# Patient Record
Sex: Female | Born: 1945 | Race: White | Hispanic: No | Marital: Married | Smoking: Former smoker
Health system: Southern US, Community
[De-identification: ages and names within clinical notes are randomized; demographics above are authoritative.]

## PROBLEM LIST (undated history)

## (undated) DIAGNOSIS — C801 Malignant (primary) neoplasm, unspecified: Secondary | ICD-10-CM

## (undated) DIAGNOSIS — Z9889 Other specified postprocedural states: Secondary | ICD-10-CM

## (undated) DIAGNOSIS — M1990 Unspecified osteoarthritis, unspecified site: Secondary | ICD-10-CM

## (undated) DIAGNOSIS — R112 Nausea with vomiting, unspecified: Secondary | ICD-10-CM

## (undated) HISTORY — PX: BREAST EXCISIONAL BIOPSY: SUR124

## (undated) HISTORY — PX: AXILLARY LYMPH NODE BIOPSY: SHX5737

## (undated) HISTORY — PX: APPENDECTOMY: SHX54

## (undated) HISTORY — PX: ABDOMINAL HYSTERECTOMY: SHX81

## (undated) HISTORY — PX: OOPHORECTOMY: SHX86

---

## 2006-04-16 ENCOUNTER — Ambulatory Visit: Payer: Self-pay

## 2007-07-29 ENCOUNTER — Ambulatory Visit: Payer: Self-pay

## 2009-06-30 ENCOUNTER — Ambulatory Visit: Payer: Self-pay

## 2010-12-07 ENCOUNTER — Ambulatory Visit: Payer: Self-pay

## 2010-12-08 ENCOUNTER — Ambulatory Visit: Payer: Self-pay

## 2011-05-14 ENCOUNTER — Ambulatory Visit: Payer: Self-pay | Admitting: Surgery

## 2011-12-10 ENCOUNTER — Ambulatory Visit: Payer: Self-pay | Admitting: Surgery

## 2011-12-31 ENCOUNTER — Ambulatory Visit: Payer: Self-pay | Admitting: Surgery

## 2012-01-02 LAB — PATHOLOGY REPORT

## 2012-12-03 HISTORY — PX: BREAST BIOPSY: SHX20

## 2013-02-26 ENCOUNTER — Ambulatory Visit: Payer: Self-pay

## 2013-06-17 DIAGNOSIS — C4492 Squamous cell carcinoma of skin, unspecified: Secondary | ICD-10-CM

## 2013-06-17 DIAGNOSIS — C4491 Basal cell carcinoma of skin, unspecified: Secondary | ICD-10-CM

## 2013-06-17 HISTORY — DX: Squamous cell carcinoma of skin, unspecified: C44.92

## 2013-06-17 HISTORY — DX: Basal cell carcinoma of skin, unspecified: C44.91

## 2013-12-17 ENCOUNTER — Ambulatory Visit: Payer: Self-pay | Admitting: Gastroenterology

## 2014-01-13 ENCOUNTER — Ambulatory Visit: Payer: Self-pay | Admitting: Unknown Physician Specialty

## 2014-03-15 ENCOUNTER — Ambulatory Visit: Payer: Self-pay

## 2015-03-27 NOTE — Op Note (Signed)
PATIENT NAME:  Whitney Liu, Whitney Liu MR#:  [id number] DATE OF BIRTH:  [birth year]  DATE OF PROCEDURE:  12/31/2011  PREOPERATIVE DIAGNOSIS: Right breast mass.   POSTOPERATIVE DIAGNOSIS: Right breast mass.   PROCEDURE: Excision of right breast mass.   SURGEON: Rochel Brome, M.D.   ANESTHESIA: Local 1% Xylocaine with monitored anesthesia care.   INDICATIONS: This 69 year old has a history of right breast nodule. She has had previous fine-needle aspiration which demonstrated some lymphocytes and adipose tissue. She recently had a follow-up ultrasound demonstrating the nodule some 5 millimeters in dimension, hypoechoic and a mammogram demonstrated some slight irregularity of the margins of the mass and she had preop x-ray needle localization and excision was recommended for further evaluation and treatment.   DESCRIPTION OF PROCEDURE: The patient was placed on the operating table in the supine position. The right arm was placed on a lateral arm support. The dressing was removed from the upper aspect of the right breast exposing the Kopans wire which was cut 3 cm from the skin. The area was prepared with ChloraPrep and draped in a sterile manner. The mammograms were reviewed in the Operating Room immediately prior to incision. The site was examined and could see the location of the wire in the axillary portion of the 11 o'clock position of the right breast. The skin inferior to this was infiltrated with Xylocaine. A curvilinear obliquely oriented incision was made over the intended site of the thick portion of the wire and dissection was carried down through subcutaneous tissues. The location of the thick portion of the wire was demonstrated. There was a palpable nodule adjacent and slightly posterior to the thick portion of the wire. A sample of tissue surrounding the nodule was removed using electrocautery for hemostasis. Two bleeding points were clamped with hemostats. The specimen was excised and submitted for  specimen mammogram. The two bleeding points were suture ligated with 4-0 chromic. Several small bleeding points were cauterized. Hemostasis was now intact. The wound was palpated. There was no other palpable mass within the operative site. Next, the skin was closed with a running 4-0 chromic subcuticular suture.   The radiology department called to report that the mass was seen in the specimen mammogram and this was submitted for routine pathology. Next, the wound was treated with Dermabond and allowed to dry. The patient tolerated the procedure satisfactorily and was then prepared for transfer to the recovery room.  ____________________________ Lenna Sciara. Rochel Brome, MD jws:ap D: 12/31/2011 11:37:35 ET T: 12/31/2011 11:43:51 ET JOB#: [id number]  cc: Loreli Dollar, MD, <Dictator> Loreli Dollar MD ELECTRONICALLY SIGNED 01/06/2012 15:20

## 2015-03-30 ENCOUNTER — Other Ambulatory Visit: Payer: Self-pay | Admitting: Obstetrics and Gynecology

## 2015-03-30 DIAGNOSIS — Z1231 Encounter for screening mammogram for malignant neoplasm of breast: Secondary | ICD-10-CM

## 2015-04-04 DIAGNOSIS — C4492 Squamous cell carcinoma of skin, unspecified: Secondary | ICD-10-CM

## 2015-04-04 HISTORY — DX: Squamous cell carcinoma of skin, unspecified: C44.92

## 2015-04-21 ENCOUNTER — Ambulatory Visit
Admission: RE | Admit: 2015-04-21 | Discharge: 2015-04-21 | Disposition: A | Discharge status: Home / Self Care | Payer: Medicare PPO | Source: Ambulatory Visit | Attending: Obstetrics and Gynecology | Admitting: Obstetrics and Gynecology

## 2015-04-21 ENCOUNTER — Other Ambulatory Visit: Payer: Self-pay | Admitting: Obstetrics and Gynecology

## 2015-04-21 DIAGNOSIS — Z1231 Encounter for screening mammogram for malignant neoplasm of breast: Secondary | ICD-10-CM | POA: Diagnosis present

## 2015-04-21 HISTORY — DX: Malignant (primary) neoplasm, unspecified: C80.1

## 2016-03-27 ENCOUNTER — Other Ambulatory Visit: Payer: Self-pay | Admitting: Family Medicine

## 2016-03-27 DIAGNOSIS — Z1231 Encounter for screening mammogram for malignant neoplasm of breast: Secondary | ICD-10-CM

## 2016-04-25 ENCOUNTER — Other Ambulatory Visit: Payer: Self-pay | Admitting: Family Medicine

## 2016-04-25 ENCOUNTER — Ambulatory Visit
Admission: RE | Admit: 2016-04-25 | Discharge: 2016-04-25 | Disposition: A | Discharge status: Home / Self Care | Payer: Medicare Other | Source: Ambulatory Visit | Attending: Family Medicine | Admitting: Family Medicine

## 2016-04-25 DIAGNOSIS — Z1231 Encounter for screening mammogram for malignant neoplasm of breast: Secondary | ICD-10-CM

## 2017-04-08 ENCOUNTER — Other Ambulatory Visit: Payer: Self-pay | Admitting: Family Medicine

## 2017-04-08 DIAGNOSIS — Z1231 Encounter for screening mammogram for malignant neoplasm of breast: Secondary | ICD-10-CM

## 2017-05-08 ENCOUNTER — Ambulatory Visit
Admission: RE | Admit: 2017-05-08 | Discharge: 2017-05-08 | Disposition: A | Discharge status: Home / Self Care | Payer: Medicare Other | Source: Ambulatory Visit | Attending: Family Medicine | Admitting: Family Medicine

## 2017-05-08 ENCOUNTER — Encounter: Payer: Self-pay | Admitting: Radiology

## 2017-05-08 DIAGNOSIS — Z1231 Encounter for screening mammogram for malignant neoplasm of breast: Secondary | ICD-10-CM

## 2017-07-02 ENCOUNTER — Inpatient Hospital Stay: Payer: Medicare Other

## 2017-07-02 ENCOUNTER — Emergency Department: Payer: Medicare Other

## 2017-07-02 ENCOUNTER — Encounter: Payer: Self-pay | Admitting: *Deleted

## 2017-07-02 ENCOUNTER — Inpatient Hospital Stay
Admission: EM | Admit: 2017-07-02 | Discharge: 2017-07-04 | DRG: 392 | Disposition: A | Discharge status: Home / Self Care | Payer: Medicare Other | Attending: Internal Medicine | Admitting: Internal Medicine

## 2017-07-02 DIAGNOSIS — M25551 Pain in right hip: Secondary | ICD-10-CM | POA: Diagnosis present

## 2017-07-02 DIAGNOSIS — Z882 Allergy status to sulfonamides status: Secondary | ICD-10-CM

## 2017-07-02 DIAGNOSIS — M5442 Lumbago with sciatica, left side: Secondary | ICD-10-CM | POA: Diagnosis present

## 2017-07-02 DIAGNOSIS — E785 Hyperlipidemia, unspecified: Secondary | ICD-10-CM | POA: Diagnosis present

## 2017-07-02 DIAGNOSIS — M5441 Lumbago with sciatica, right side: Secondary | ICD-10-CM

## 2017-07-02 DIAGNOSIS — M5410 Radiculopathy, site unspecified: Secondary | ICD-10-CM | POA: Diagnosis present

## 2017-07-02 DIAGNOSIS — Z79899 Other long term (current) drug therapy: Secondary | ICD-10-CM

## 2017-07-02 DIAGNOSIS — Z85828 Personal history of other malignant neoplasm of skin: Secondary | ICD-10-CM | POA: Diagnosis not present

## 2017-07-02 DIAGNOSIS — K5792 Diverticulitis of intestine, part unspecified, without perforation or abscess without bleeding: Principal | ICD-10-CM | POA: Diagnosis present

## 2017-07-02 DIAGNOSIS — Z87891 Personal history of nicotine dependence: Secondary | ICD-10-CM

## 2017-07-02 DIAGNOSIS — Z885 Allergy status to narcotic agent status: Secondary | ICD-10-CM

## 2017-07-02 LAB — URINALYSIS, COMPLETE (UACMP) WITH MICROSCOPIC
BACTERIA UA: NONE SEEN
BILIRUBIN URINE: NEGATIVE
Glucose, UA: NEGATIVE mg/dL
HGB URINE DIPSTICK: NEGATIVE
KETONES UR: NEGATIVE mg/dL
LEUKOCYTES UA: NEGATIVE
NITRITE: NEGATIVE
PROTEIN: NEGATIVE mg/dL
SPECIFIC GRAVITY, URINE: 1.017 (ref 1.005–1.030)
SQUAMOUS EPITHELIAL / LPF: NONE SEEN
pH: 7 (ref 5.0–8.0)

## 2017-07-02 LAB — CBC
HCT: 39.1 % (ref 35.0–47.0)
Hemoglobin: 13.6 g/dL (ref 12.0–16.0)
MCH: 32.2 pg (ref 26.0–34.0)
MCHC: 34.9 g/dL (ref 32.0–36.0)
MCV: 92.5 fL (ref 80.0–100.0)
Platelets: 221 10*3/uL (ref 150–440)
RBC: 4.22 MIL/uL (ref 3.80–5.20)
RDW: 12.6 % (ref 11.5–14.5)
WBC: 11.2 10*3/uL — AB (ref 3.6–11.0)

## 2017-07-02 LAB — BASIC METABOLIC PANEL
ANION GAP: 7 (ref 5–15)
BUN: 12 mg/dL (ref 6–20)
CALCIUM: 9.3 mg/dL (ref 8.9–10.3)
CO2: 24 mmol/L (ref 22–32)
Chloride: 107 mmol/L (ref 101–111)
Creatinine, Ser: 0.82 mg/dL (ref 0.44–1.00)
GFR calc non Af Amer: >60 mL/min (ref >60)
GLUCOSE: 132 mg/dL — AB (ref 65–99)
Potassium: 4.2 mmol/L (ref 3.5–5.1)
Sodium: 138 mmol/L (ref 135–145)

## 2017-07-02 LAB — TSH: TSH: 0.971 u[IU]/mL (ref 0.350–4.500)

## 2017-07-02 MED ORDER — METRONIDAZOLE IN NACL 5-0.79 MG/ML-% IV SOLN
500.0000 mg | Freq: Once | INTRAVENOUS | Status: AC
  Administered 2017-07-02: 500 mg via INTRAVENOUS
  Filled 2017-07-02: qty 100

## 2017-07-02 MED ORDER — MIDAZOLAM HCL 2 MG/2ML IJ SOLN
2.0000 mg | Freq: Once | INTRAMUSCULAR | Status: AC
  Administered 2017-07-02: 2 mg via INTRAVENOUS

## 2017-07-02 MED ORDER — METRONIDAZOLE 500 MG PO TABS
500.0000 mg | ORAL_TABLET | Freq: Three times a day (TID) | ORAL | Status: DC
  Administered 2017-07-02 – 2017-07-04 (×6): 500 mg via ORAL
  Filled 2017-07-02 (×8): qty 1

## 2017-07-02 MED ORDER — ONDANSETRON HCL 4 MG/2ML IJ SOLN
4.0000 mg | Freq: Four times a day (QID) | INTRAMUSCULAR | Status: DC | PRN
  Administered 2017-07-02 (×2): 4 mg via INTRAVENOUS
  Filled 2017-07-02 (×3): qty 2

## 2017-07-02 MED ORDER — DOCUSATE SODIUM 100 MG PO CAPS
100.0000 mg | ORAL_CAPSULE | Freq: Two times a day (BID) | ORAL | Status: DC
  Administered 2017-07-02 – 2017-07-04 (×5): 100 mg via ORAL
  Filled 2017-07-02 (×5): qty 1

## 2017-07-02 MED ORDER — KETOROLAC TROMETHAMINE 30 MG/ML IJ SOLN
30.0000 mg | Freq: Once | INTRAMUSCULAR | Status: AC
  Administered 2017-07-02: 30 mg via INTRAVENOUS
  Filled 2017-07-02: qty 1

## 2017-07-02 MED ORDER — METHYLPREDNISOLONE 4 MG PO TBPK
4.0000 mg | ORAL_TABLET | Freq: Three times a day (TID) | ORAL | Status: AC
  Administered 2017-07-03 (×3): 4 mg via ORAL

## 2017-07-02 MED ORDER — CALCIUM CARBONATE-VITAMIN D 500-200 MG-UNIT PO TABS
1.0000 | ORAL_TABLET | Freq: Two times a day (BID) | ORAL | Status: DC
  Administered 2017-07-02 – 2017-07-03 (×4): 1 via ORAL
  Filled 2017-07-02 (×4): qty 1

## 2017-07-02 MED ORDER — MELOXICAM 7.5 MG PO TABS
15.0000 mg | ORAL_TABLET | Freq: Every day | ORAL | Status: DC
  Administered 2017-07-03: 15 mg via ORAL
  Filled 2017-07-02 (×2): qty 2

## 2017-07-02 MED ORDER — ENOXAPARIN SODIUM 40 MG/0.4ML ~~LOC~~ SOLN
40.0000 mg | SUBCUTANEOUS | Status: DC
  Administered 2017-07-02 – 2017-07-03 (×2): 40 mg via SUBCUTANEOUS
  Filled 2017-07-02: qty 0.4

## 2017-07-02 MED ORDER — CIPROFLOXACIN HCL 500 MG PO TABS
500.0000 mg | ORAL_TABLET | Freq: Two times a day (BID) | ORAL | Status: DC
  Administered 2017-07-02 – 2017-07-04 (×4): 500 mg via ORAL
  Filled 2017-07-02 (×4): qty 1

## 2017-07-02 MED ORDER — METHYLPREDNISOLONE 4 MG PO TBPK
8.0000 mg | ORAL_TABLET | Freq: Every evening | ORAL | Status: AC
  Administered 2017-07-02: 8 mg via ORAL

## 2017-07-02 MED ORDER — SODIUM CHLORIDE 0.9 % IV SOLN
INTRAVENOUS | Status: DC
  Administered 2017-07-02 – 2017-07-03 (×2): via INTRAVENOUS

## 2017-07-02 MED ORDER — OMEGA-3-ACID ETHYL ESTERS 1 G PO CAPS
1.0000 g | ORAL_CAPSULE | Freq: Every day | ORAL | Status: DC
  Administered 2017-07-02 – 2017-07-03 (×2): 1 g via ORAL
  Filled 2017-07-02 (×2): qty 1

## 2017-07-02 MED ORDER — ADULT MULTIVITAMIN W/MINERALS CH
1.0000 | ORAL_TABLET | Freq: Every day | ORAL | Status: DC
  Administered 2017-07-02 – 2017-07-03 (×2): 1 via ORAL
  Filled 2017-07-02 (×2): qty 1

## 2017-07-02 MED ORDER — METHYLPREDNISOLONE 4 MG PO TBPK
4.0000 mg | ORAL_TABLET | Freq: Four times a day (QID) | ORAL | Status: DC
  Administered 2017-07-04: 4 mg via ORAL

## 2017-07-02 MED ORDER — MORPHINE SULFATE (PF) 2 MG/ML IV SOLN: 2.0000 mg | INTRAVENOUS | Status: DC | PRN

## 2017-07-02 MED ORDER — METHYLPREDNISOLONE 4 MG PO TBPK
8.0000 mg | ORAL_TABLET | Freq: Every evening | ORAL | Status: AC
  Administered 2017-07-03: 8 mg via ORAL

## 2017-07-02 MED ORDER — METHYLPREDNISOLONE 4 MG PO TBPK
4.0000 mg | ORAL_TABLET | ORAL | Status: AC
  Administered 2017-07-02: 4 mg via ORAL

## 2017-07-02 MED ORDER — ONDANSETRON HCL 4 MG/2ML IJ SOLN
INTRAMUSCULAR | Status: AC
  Administered 2017-07-02: 4 mg via INTRAVENOUS
  Filled 2017-07-02: qty 2

## 2017-07-02 MED ORDER — ACETAMINOPHEN 325 MG PO TABS: 650.0000 mg | ORAL_TABLET | Freq: Four times a day (QID) | ORAL | Status: DC | PRN

## 2017-07-02 MED ORDER — ACETAMINOPHEN 650 MG RE SUPP: 650.0000 mg | Freq: Four times a day (QID) | RECTAL | Status: DC | PRN

## 2017-07-02 MED ORDER — ONDANSETRON HCL 4 MG PO TABS
4.0000 mg | ORAL_TABLET | Freq: Four times a day (QID) | ORAL | Status: DC | PRN
  Administered 2017-07-04: 4 mg via ORAL
  Filled 2017-07-02: qty 1

## 2017-07-02 MED ORDER — ONDANSETRON HCL 4 MG/2ML IJ SOLN
4.0000 mg | Freq: Once | INTRAMUSCULAR | Status: AC
  Administered 2017-07-02: 4 mg via INTRAVENOUS

## 2017-07-02 MED ORDER — MIDAZOLAM HCL 5 MG/5ML IJ SOLN
INTRAMUSCULAR | Status: AC
  Administered 2017-07-02: 2 mg via INTRAVENOUS
  Filled 2017-07-02: qty 5

## 2017-07-02 MED ORDER — METHYLPREDNISOLONE 4 MG PO TBPK
8.0000 mg | ORAL_TABLET | Freq: Every morning | ORAL | Status: AC
  Administered 2017-07-02: 8 mg via ORAL
  Filled 2017-07-02: qty 21

## 2017-07-02 MED ORDER — OXYCODONE-ACETAMINOPHEN 5-325 MG PO TABS
2.0000 | ORAL_TABLET | Freq: Once | ORAL | Status: AC
  Administered 2017-07-02: 2 via ORAL
  Filled 2017-07-02: qty 2

## 2017-07-02 MED ORDER — HYDROCODONE-ACETAMINOPHEN 5-325 MG PO TABS
1.0000 | ORAL_TABLET | ORAL | Status: DC | PRN
  Administered 2017-07-03 (×3): 1 via ORAL
  Filled 2017-07-02 (×3): qty 1

## 2017-07-02 MED ORDER — POLYETHYLENE GLYCOL 3350 17 G PO PACK: 17.0000 g | PACK | Freq: Every day | ORAL | Status: DC | PRN

## 2017-07-02 MED ORDER — CIPROFLOXACIN IN D5W 400 MG/200ML IV SOLN
400.0000 mg | Freq: Once | INTRAVENOUS | Status: AC
  Administered 2017-07-02: 400 mg via INTRAVENOUS
  Filled 2017-07-02: qty 200

## 2017-07-02 MED ORDER — PROMETHAZINE HCL 25 MG/ML IJ SOLN
12.5000 mg | Freq: Four times a day (QID) | INTRAMUSCULAR | Status: DC | PRN
  Administered 2017-07-02 – 2017-07-04 (×2): 12.5 mg via INTRAVENOUS
  Filled 2017-07-02 (×2): qty 1

## 2017-07-02 NOTE — ED Triage Notes (Signed)
Pt presents w/ on-going c/o R hip, leg and back pain. Pt was injured by a ceiling falling on her Right side and sustaining bruising to R upper thigh. Pt states increasing pain on R side worsening approximately 5 days after fall and becoming unmanageable tonight. Pt too meloxicam prior to arrival w/o relief. Pt c/o nausea related to pain. Pt in obvious distress and is unable to change position or tolerate palpation of injured side.

## 2017-07-02 NOTE — ED Provider Notes (Signed)
[facility] Emergency Department Provider Note   ____________________________________________   First MD Initiated Contact with Patient 07/02/17 0300     (approximate)  I have reviewed the triage vital signs and the nursing notes.   HISTORY  Chief Complaint Back Pain and Hip Pain    HPI Whitney Liu is a 71 y.o. female who comes into the hospital today with back pain and hip pain. She reports that she's had this pain on and off for a couple of weeks. She called her primary care physician and the nurse called. She said the patient see my to spasm and they thought maybe she was having some urinary tract infection. She went to get a urine done last week Thursday or Friday but was told that everything was fine. She reports that she has an appointment for Wednesday morning but she reports that the pain is worsened. She went to bed around 8:30 and woke up with severe pain. She reports that she could barely move her right leg and her right side due to the pain. The patient reports that this pain on and off since. It constant crampy pain and it hurts worse when she moves. The patient states that she was in [facility] a couple of weeks ago and the ceiling fell on her in their hotel room. She reports that she was evaluated by EMS but was never seen at the hospital. She reports that she could not tolerate this pain and decided to come into the hospital today for evaluation.The patient reports that she did vomit due to the pain. She has no chest pain or shortness of breath no headache or dizziness.   Past Medical History:  Diagnosis Date  . Arthritis   . Cancer ([place])    skin  . PONV (postoperative nausea and vomiting)     There are no active problems to display for this patient.   Past Surgical History:  Procedure Laterality Date  . ABDOMINAL HYSTERECTOMY    . APPENDECTOMY    . AXILLARY LYMPH NODE BIOPSY Right   . BREAST BIOPSY Right 2014   neg    Prior to  Admission medications   Medication Sig Start Date End Date Taking? Authorizing Provider  acetaminophen (TYLENOL) 325 MG tablet Take 650 mg by mouth every 6 (six) hours as needed.    [provider]  calcium-vitamin D 250-100 MG-UNIT tablet Take 1 tablet by mouth 2 (two) times daily.    [provider]  cetirizine (ZYRTEC) 10 MG tablet Take 10 mg by mouth daily.    [provider]  Multiple Vitamin (MULTIVITAMIN) capsule Take 1 capsule by mouth daily.    [provider]  Omega-3 Fatty Acids (FISH OIL) 1200 MG CAPS Take by mouth.    [provider]    Allergies Codeine and Sulfa antibiotics  Family History  Problem Relation Age of Onset  . Breast cancer Sister 8    Social History Social History  Substance Use Topics  . Smoking status: Former Smoker    Quit date: 07/02/1971  . Smokeless tobacco: Never Used  . Alcohol use Yes     Comment: occasional     Review of Systems  Constitutional: No fever/chills Eyes: No visual changes. ENT: No sore throat. Cardiovascular: Denies chest pain. Respiratory: Denies shortness of breath. Gastrointestinal: No abdominal pain.  No nausea, no vomiting.  No diarrhea.  No constipation. Genitourinary: Negative for dysuria. Musculoskeletal: Right back pain and hip pain with positive  straight leg raise bilaterally Skin: Negative for rash. Neurological: Negative for headaches, focal weakness or numbness.   ____________________________________________   PHYSICAL EXAM:  VITAL SIGNS: ED Triage Vitals  Enc Vitals Group     BP 07/02/17 0309 (!) 152/78     Pulse Rate 07/02/17 0309 92     Resp 07/02/17 0309 20     Temp 07/02/17 0309 98.1 F (36.7 C)     Temp Source 07/02/17 0309 Oral     SpO2 07/02/17 0309 100 %     Weight 07/02/17 0310 152 lb (68.9 kg)     Height 07/02/17 0310 5\' 5"  (1.651 m)     Head Circumference --      Peak Flow --      Pain Score 07/02/17 0308 10     Pain Loc --      Pain  Edu? --      Excl. in [place]? --     Constitutional: Alert and oriented. Uncomfortable appearing and in Moderate to severe distress. Eyes: Conjunctivae are normal. PERRL. EOMI. Head: Atraumatic. Nose: No congestion/rhinnorhea. Mouth/Throat: Mucous membranes are moist.  Oropharynx non-erythematous. Cardiovascular: Normal rate, regular rhythm. Grossly normal heart sounds.  Good peripheral circulation. Respiratory: Normal respiratory effort.  No retractions. Lungs CTAB. Gastrointestinal: Soft and nontender. No distention. Positive bowel sounds Musculoskeletal: The patient has tenderness to palpation of her right back, right hip and right groin. She also has positive straight leg raise bilaterally. The patient also has some mild abdominal pain. She is in some significant pain and jumps and screams every time she is moved. Neurologic:  Normal speech and language.  Skin:  Skin is warm, dry and intact.  Psychiatric: Mood and affect are normal. .  ____________________________________________   LABS (all labs ordered are listed, but only abnormal results are displayed)  Labs Reviewed  CBC - Abnormal; Notable for the following:       Result Value   WBC 11.2 (*)    All other components within normal limits  BASIC METABOLIC PANEL - Abnormal; Notable for the following:    Glucose, Bld 132 (*)    All other components within normal limits  URINALYSIS, COMPLETE (UACMP) WITH MICROSCOPIC - Abnormal; Notable for the following:    Color, Urine YELLOW (*)    APPearance CLEAR (*)    All other components within normal limits   ____________________________________________  EKG  none ____________________________________________  RADIOLOGY  Ct Renal Stone Study  Result Date: 07/02/2017 CLINICAL DATA:  71 y/o F; injury to right side with bruising of right upper thigh with ongoing and worsening pain of right hip, leg, and back. EXAM: CT ABDOMEN AND PELVIS WITHOUT CONTRAST TECHNIQUE: Multidetector CT  imaging of the abdomen and pelvis was performed following the standard protocol without IV contrast. COMPARISON:  None. FINDINGS: Lower chest: No acute abnormality. Hepatobiliary: No focal liver abnormality is seen. No gallstones, gallbladder wall thickening, or biliary dilatation. Pancreas: Unremarkable. No pancreatic ductal dilatation or surrounding inflammatory changes. Spleen: Normal in size without focal abnormality. Adrenals/Urinary Tract: Adrenal glands are unremarkable. Kidneys are normal, without renal calculi, focal lesion, or hydronephrosis. Bladder is unremarkable. Stomach/Bowel: Normal stomach. Normal small bowel. Acute sigmoid diverticulitis. No evidence for perforation or abscess. Status post appendectomy. No bowel obstruction. Vascular/Lymphatic: Aortic atherosclerosis with minimal calcification. No enlarged abdominal or pelvic lymph nodes. Reproductive: Status post hysterectomy. No adnexal masses. Other: Small volume of fluid in the lower abdomen. Musculoskeletal: No acute or significant osseous findings. IMPRESSION: Acute sigmoid diverticulitis. No evidence  for perforation or abscess. Electronically Signed   By: Kristine Garbe M.D.   On: 07/02/2017 04:09    ____________________________________________   PROCEDURES  Procedure(s) performed: None  Procedures  Critical Care performed: No  ____________________________________________   INITIAL IMPRESSION / ASSESSMENT AND PLAN / ED COURSE  Pertinent labs & imaging results that were available during my care of the patient were reviewed by me and considered in my medical decision making (see chart for details).  This is a 71 year old female who comes into the hospital today with some back hip and groin pain. I did give the patient some Percocet, Versed, Toradol for her pain. I sent the patient for CT renal stone study she's been having this back pain to see if it was due to a kidney stone or if there are any fractures. The  patient's CT is significant for acute diverticulitis. The patient did report that she had vomited but she reports that she thought it was due to the pain. I give the patient some ciprofloxacin and Flagyl. The patient states that her pain is still about a 5 but it seemed to be coming back. I will give the patient a small dose of morphine and I will admit her to the hospitalist service for further evaluation of her back pain of her diverticulitis.      ____________________________________________   FINAL CLINICAL IMPRESSION(S) / ED DIAGNOSES  Final diagnoses:  Diverticulitis  Acute right-sided low back pain with bilateral sciatica  Right hip pain      NEW MEDICATIONS STARTED DURING THIS VISIT:  New Prescriptions   No medications on file     Note:  This document was prepared using Dragon voice recognition software and may include unintentional dictation errors.    Loney Hering, MD 07/02/17 [id number]

## 2017-07-02 NOTE — ED Notes (Signed)
Family sitting in room with family, pt resting in bed

## 2017-07-02 NOTE — H&P (Addendum)
[place] [facility] [place] NAME: Whitney Liu    MR#:  [id number]  DATE OF BIRTH:  1946-07-18  DATE OF ADMISSION:  07/02/2017  PRIMARY CARE PHYSICIAN: Mariana Arn, MD   REQUESTING/REFERRING PHYSICIAN: Dr. Charlesetta Ivory  CHIEF COMPLAINT:   Chief Complaint  Patient presents with  . Back Pain  . Hip Pain    HISTORY OF PRESENT ILLNESS:  Whitney Liu  is a 71 y.o. female with a known history of  Skin cancer, arthritis presents to the hospital secondary to worsening lower back pain radiating down her right groin. Patient is relatively healthy and very independent at baseline. 2 weeks ago they were in a hotel in [place], but a portion of ceiling fell off with a bucket of water on her head due to construction issues and hurting her right side. She has had some muscle soreness after that but no significant pain like she had early this morning. For the last 5 days her back has been giving some troubles and she was having some radicular pain going down to her lower abdomen. Denies any fevers or chills. No nausea or vomiting. Had some chills yesterday with lower abdominal pain late yesterday. When she was climbing up the stairs, she felt strain in her right flank. But she woke up in the middle of the night needed to use the restroom, could not move at all due to significant pain and spasms along curve lower back radiating down to her right groin. Denies any loss of sensation. No loss of bowel or bladder function. Denies any new trauma recently.  PAST MEDICAL HISTORY:   Past Medical History:  Diagnosis Date  . Arthritis   . Cancer ([place])    skin  . PONV (postoperative nausea and vomiting)     PAST SURGICAL HISTORY:   Past Surgical History:  Procedure Laterality Date  . ABDOMINAL HYSTERECTOMY    . APPENDECTOMY    . AXILLARY LYMPH NODE BIOPSY Right   . BREAST BIOPSY Right 2014   neg    SOCIAL HISTORY:   Social History  Substance Use Topics  .  Smoking status: Former Smoker    Quit date: 07/02/1971  . Smokeless tobacco: Never Used  . Alcohol use Yes     Comment: occasional     FAMILY HISTORY:   Family History  Problem Relation Age of Onset  . Breast cancer Sister 17  . Cirrhosis Mother   . Diabetes Father     DRUG ALLERGIES:   Allergies  Allergen Reactions  . Codeine Nausea And Vomiting  . Sulfa Antibiotics Nausea And Vomiting    REVIEW OF SYSTEMS:   Review of Systems  Constitutional: Negative for chills, fever, malaise/fatigue and weight loss.  HENT: Negative for ear discharge, ear pain, hearing loss, nosebleeds and tinnitus.   Eyes: Negative for blurred vision, double vision and photophobia.  Respiratory: Negative for cough, hemoptysis, shortness of breath and wheezing.   Cardiovascular: Negative for chest pain, palpitations, orthopnea and leg swelling.  Gastrointestinal: Positive for abdominal pain. Negative for constipation, diarrhea, heartburn, melena, nausea and vomiting.  Genitourinary: Negative for dysuria, frequency, hematuria and urgency.  Musculoskeletal: Positive for back pain, joint pain and myalgias. Negative for neck pain.  Skin: Negative for rash.  Neurological: Negative for dizziness, tingling, tremors, sensory change, speech change, focal weakness and headaches.  Endo/Heme/Allergies: Does not bruise/bleed easily.  Psychiatric/Behavioral: Negative for depression.    MEDICATIONS AT HOME:   Prior to Admission  medications   Medication Sig Start Date End Date Taking? Authorizing Provider  acetaminophen (TYLENOL) 325 MG tablet Take 650 mg by mouth every 6 (six) hours as needed.   Yes [provider]  calcium-vitamin D 250-100 MG-UNIT tablet Take 1 tablet by mouth 2 (two) times daily.   Yes [provider]  cetirizine (ZYRTEC) 10 MG tablet Take 10 mg by mouth daily.   Yes [provider]  meloxicam (MOBIC) 15 MG tablet Take 15 mg by mouth daily. 05/22/17 05/22/18 Yes  [provider]  Multiple Vitamin (MULTIVITAMIN) capsule Take 1 capsule by mouth daily.   Yes [provider]  Omega-3 Fatty Acids (FISH OIL) 1200 MG CAPS Take 1 capsule by mouth daily.    Yes [provider]      VITAL SIGNS:  Blood pressure 109/61, pulse 70, temperature 98.1 F (36.7 C), temperature source Oral, resp. rate 16, height 5\' 5"  (1.651 m), weight 68.9 kg (152 lb), SpO2 96 %.  PHYSICAL EXAMINATION:   Physical Exam  GENERAL:  71 y.o.-year-old patient lying in the bed with no acute distress.  EYES: Pupils equal, round, reactive to light and accommodation. No scleral icterus. Extraocular muscles intact.  HEENT: Head atraumatic, normocephalic. Oropharynx and nasopharynx clear.  NECK:  Supple, no jugular venous distention. No thyroid enlargement, no tenderness.  LUNGS: Normal breath sounds bilaterally, no wheezing, rales,rhonchi or crepitation. No use of accessory muscles of respiration.  CARDIOVASCULAR: S1, S2 normal. No murmurs, rubs, or gallops.  ABDOMEN: Soft, nontender, nondistended. Bowel sounds present. No organomegaly or mass.  EXTREMITIES: No pedal edema, cyanosis, or clubbing.  Tender in the lower back with right hip swelling and pain when moving the hip NEUROLOGIC: Cranial nerves II through XII are intact. Muscle strength 5/5 in all extremities. Sensation intact. Gait not checked.  PSYCHIATRIC: The patient is alert and oriented x 3.  SKIN: No obvious rash, lesion, or ulcer.   LABORATORY PANEL:   CBC  Recent Labs Lab 07/02/17 0310  WBC 11.2*  HGB 13.6  HCT 39.1  PLT 221   ------------------------------------------------------------------------------------------------------------------  Chemistries   Recent Labs Lab 07/02/17 0310  NA 138  K 4.2  CL 107  CO2 24  GLUCOSE 132*  BUN 12  CREATININE 0.82  CALCIUM 9.3    ------------------------------------------------------------------------------------------------------------------  Cardiac Enzymes No results for input(s): TROPONINI in the last 168 hours. ------------------------------------------------------------------------------------------------------------------  RADIOLOGY:  Ct Renal Stone Study  Result Date: 07/02/2017 CLINICAL DATA:  71 y/o F; injury to right side with bruising of right upper thigh with ongoing and worsening pain of right hip, leg, and back. EXAM: CT ABDOMEN AND PELVIS WITHOUT CONTRAST TECHNIQUE: Multidetector CT imaging of the abdomen and pelvis was performed following the standard protocol without IV contrast. COMPARISON:  None. FINDINGS: Lower chest: No acute abnormality. Hepatobiliary: No focal liver abnormality is seen. No gallstones, gallbladder wall thickening, or biliary dilatation. Pancreas: Unremarkable. No pancreatic ductal dilatation or surrounding inflammatory changes. Spleen: Normal in size without focal abnormality. Adrenals/Urinary Tract: Adrenal glands are unremarkable. Kidneys are normal, without renal calculi, focal lesion, or hydronephrosis. Bladder is unremarkable. Stomach/Bowel: Normal stomach. Normal small bowel. Acute sigmoid diverticulitis. No evidence for perforation or abscess. Status post appendectomy. No bowel obstruction. Vascular/Lymphatic: Aortic atherosclerosis with minimal calcification. No enlarged abdominal or pelvic lymph nodes. Reproductive: Status post hysterectomy. No adnexal masses. Other: Small volume of fluid in the lower abdomen. Musculoskeletal: No acute or significant osseous findings. IMPRESSION: Acute sigmoid diverticulitis. No evidence for perforation or abscess. Electronically Signed  By: Kristine Garbe M.D.   On: 07/02/2017 04:09    EKG:  No orders found for this or any previous visit.  IMPRESSION AND PLAN:   Ligia Duguay  is a 71 y.o. female with a known history of  Skin  cancer, arthritis presents to the hospital secondary to worsening lower back pain radiating down her right groin.  #1 Right hip pain- trauma about 2 weeks ago, none recently. Sudden onset of lower back pain with right hip pain radiating to the groin. Could be radicular symptoms -MRI of the lumbar spine ordered. -Pain medications at this time. Physical therapy when able to. -If no improvement, we'll get orthopedics consult  #2 Acute diverticulitis- minimal abdominal pain with some nausea and chills yesterday. Since WBC is elevated as well, will treat with antibiotics. -Started on oral Cipro and Flagyl for now. Gentle hydration  #3 Hyperlipidemia- continue fish oil capsules  #4 DVT Prophylaxis- on lovenox  All the records are reviewed and case discussed with ED provider. Management plans discussed with the patient, family and they are in agreement.  CODE STATUS: Full Code  TOTAL TIME TAKING CARE OF THIS PATIENT: 50 minutes.    Gladstone Lighter M.D on 07/02/2017 at 8:09 AM  Between 7am to 6pm - Pager - [phone]  After 6pm go to [email or web address] - password [email or web address] [facility]  Office  [phone]  CC: Primary care physician; Mariana Arn, MD

## 2017-07-03 LAB — CBC
HCT: 37.3 % (ref 35.0–47.0)
Hemoglobin: 12.8 g/dL (ref 12.0–16.0)
MCH: 31.6 pg (ref 26.0–34.0)
MCHC: 34.2 g/dL (ref 32.0–36.0)
MCV: 92.3 fL (ref 80.0–100.0)
PLATELETS: 224 10*3/uL (ref 150–440)
RBC: 4.04 MIL/uL (ref 3.80–5.20)
RDW: 12.5 % (ref 11.5–14.5)
WBC: 8.7 10*3/uL (ref 3.6–11.0)

## 2017-07-03 LAB — BASIC METABOLIC PANEL
Anion gap: 5 (ref 5–15)
BUN: 10 mg/dL (ref 6–20)
CALCIUM: 9.3 mg/dL (ref 8.9–10.3)
CHLORIDE: 112 mmol/L — AB (ref 101–111)
CO2: 25 mmol/L (ref 22–32)
CREATININE: 0.76 mg/dL (ref 0.44–1.00)
GFR calc non Af Amer: >60 mL/min (ref >60)
GLUCOSE: 130 mg/dL — AB (ref 65–99)
Potassium: 4 mmol/L (ref 3.5–5.1)
Sodium: 142 mmol/L (ref 135–145)

## 2017-07-03 NOTE — Evaluation (Signed)
Physical Therapy Evaluation Patient Details Name: Whitney Liu MRN: [id number] DOB: January 17, 1946 Today's Date: 07/03/2017   History of Present Illness  presented to ER with worsening low back, R hip/groin pain; admitted with acute diverticulitis and possible radicular symptoms in R LE  Clinical Impression  Upon evaluation, patient alert and oriented; follows all commands and demonstrates excellent effort/participation with all functional activities.  Mild strength deficit in R hip flex/knee extension (limited by pain); mild radicular symptoms from low back to R hip per patient report.  Able to complete bed mobility with mod indep; sit/stand, basic transfers and gait (300') without assist device, mod indep.  R hip pain rated 3-4/10 at rest, 5/10 with activity.  No buckling, LOB noted with functional mobility.  Reviewed safe technique for stair negotiation, completing with close sup level of assist.  Educated in proper sitting posture/alignment and HEP; patient voiced understanding/returned demonstration of understanding. Would benefit from skilled PT to address above deficits and promote optimal return to PLOF; recommend transition to home with continued outpatient PT as needed.    Follow Up Recommendations Outpatient PT    Equipment Recommendations       Recommendations for Other Services       Precautions / Restrictions Restrictions Weight Bearing Restrictions: No      Mobility  Bed Mobility Overal bed mobility: Modified Independent                Transfers Overall transfer level: Modified independent Equipment used: None             General transfer comment: sit/stand from variety of surface heights, mod indep. Good LE strength/power noted with movement transitions.  Ambulation/Gait Ambulation/Gait assistance: Supervision Ambulation Distance (Feet): 300 Feet Assistive device: None   Gait velocity: 10' walk time, 7 seconds   General Gait Details: reciprocal  stepping pattern with fair cadence, steady performance; no buckling or LOB.  Stairs Stairs: Yes Stairs assistance: Supervision Stair Management: One rail Right Number of Stairs: 3 General stair comments: educated in step-to gait pattern for protection/pain control in R LE; patient voiced understanding/returned demonstration without difficulty.  Wheelchair Mobility    Modified Rankin (Stroke Patients Only)       Balance Overall balance assessment: Needs assistance Sitting-balance support: No upper extremity supported;Feet supported Sitting balance-Leahy Scale: Good     Standing balance support: No upper extremity supported Standing balance-Leahy Scale: Good                               Pertinent Vitals/Pain Pain Assessment: 0-10 Pain Score: 4  Pain Location: 3-4/10 at rest, 5/10 after gait/mobility Pain Descriptors / Indicators: Aching;Grimacing;Guarding Pain Intervention(s): Limited activity within patient's tolerance;Monitored during session;Repositioned;Patient requesting pain meds-RN notified    Home Living Family/patient expects to be discharged to:: Private residence Living Arrangements: Spouse/significant other Available Help at Discharge: Family;Available 24 hours/day Type of Home: House Home Access: Stairs to enter Entrance Stairs-Rails: Right Entrance Stairs-Number of Steps: 4 Home Layout: Two level;Bed/bath upstairs Home Equipment: None      Prior Function Level of Independence: Independent         Comments: Indep with ADLs, household and community mobility without assist device; denies fall history.  Active, enjoys travelling-just returned from trip/tour over [place]        Extremity/Trunk Assessment   Upper Extremity Assessment Upper Extremity Assessment: Overall WFL for tasks assessed    Lower Extremity  Assessment Lower Extremity Assessment: Overall WFL for tasks assessed (R hip flexion, knee extension  grossly 4+/5 (limited by pain).  Denies numbness/paresthesia/radicular symptoms.   L LE grossly WFL)       Communication   Communication: No difficulties  Cognition Arousal/Alertness: Awake/alert Behavior During Therapy: WFL for tasks assessed/performed Overall Cognitive Status: Within Functional Limits for tasks assessed                                        General Comments      Exercises Other Exercises Other Exercises: Educated in lumbar extension therex in seated/standing positions; reviewed/demonstrated supine/sitting piriformis and hamstring stretching; discussed alignment of proper sitting posture (patient with questions related to volunteer work).  Provided handout with written/pictorial descriptions; patient able to demonstrate understanding of all therex.   Assessment/Plan    PT Assessment Patient needs continued PT services  PT Problem List Decreased strength;Decreased activity tolerance;Decreased balance;Decreased mobility;Pain       PT Treatment Interventions DME instruction;Gait training;Functional mobility training;Stair training;Therapeutic activities;Therapeutic exercise;Balance training;Patient/family education    PT Goals (Current goals can be found in the Care Plan section)  Acute Rehab PT Goals Patient Stated Goal: to make this pain better and be able to go home PT Goal Formulation: With patient Time For Goal Achievement: 07/17/17 Potential to Achieve Goals: Good    Frequency Min 2X/week   Barriers to discharge        Co-evaluation               AM-PAC PT "6 Clicks" Daily Activity  Outcome Measure Difficulty turning over in bed (including adjusting bedclothes, sheets and blankets)?: None Difficulty moving from lying on back to sitting on the side of the bed? : None Difficulty sitting down on and standing up from a chair with arms (e.g., wheelchair, bedside commode, etc,.)?: None Help needed moving to and from a bed to chair  (including a wheelchair)?: None Help needed walking in hospital room?: None Help needed climbing 3-5 steps with a railing? : A Little 6 Click Score: 23    End of Session Equipment Utilized During Treatment: Gait belt Activity Tolerance: Patient tolerated treatment well Patient left: in chair;with call bell/phone within reach Nurse Communication: Mobility status PT Visit Diagnosis: Pain Pain - Right/Left: Right Pain - part of body: Hip    Time: 8325-4982 PT Time Calculation (min) (ACUTE ONLY): 40 min   Charges:   PT Evaluation $PT Eval Low Complexity: 1 Low PT Treatments $Therapeutic Exercise: 8-22 mins $Therapeutic Activity: 8-22 mins   PT G Codes:   PT G-Codes **NOT FOR INPATIENT CLASS** Functional Assessment Tool Used: AM-PAC 6 Clicks Basic Mobility Functional Limitation: Mobility: Walking and moving around Mobility: Walking and Moving Around Current Status (M4158): At least 20 percent but less than 40 percent impaired, limited or restricted Mobility: Walking and Moving Around Goal Status [phone]): At least 1 percent but less than 20 percent impaired, limited or restricted    Oberia Beaudoin H. Owens Shark, PT, DPT, NCS 07/03/17, 2:09 PM [phone]

## 2017-07-03 NOTE — Progress Notes (Signed)
[facility] [place] NAME: Whitney Liu    MR#:  [id number]  DATE OF BIRTH:  09-26-1946  SUBJECTIVE:  CHIEF COMPLAINT:   Chief Complaint  Patient presents with  . Back Pain  . Hip Pain   - pain is much better today -Had some nausea yesterday, improving this morning. Physical therapy consult is pending  REVIEW OF SYSTEMS:  Review of Systems  Constitutional: Negative for chills, fever and malaise/fatigue.  HENT: Negative for congestion, ear discharge, hearing loss and nosebleeds.   Eyes: Negative for blurred vision and double vision.  Respiratory: Negative for cough, shortness of breath and wheezing.   Cardiovascular: Negative for chest pain, palpitations and leg swelling.  Gastrointestinal: Positive for constipation and nausea. Negative for abdominal pain, diarrhea and vomiting.  Genitourinary: Negative for dysuria.  Musculoskeletal: Positive for back pain and myalgias.  Neurological: Negative for dizziness, speech change, focal weakness, seizures and headaches.  Psychiatric/Behavioral: Negative for depression.    DRUG ALLERGIES:   Allergies  Allergen Reactions  . Codeine Nausea And Vomiting  . Sulfa Antibiotics Nausea And Vomiting    VITALS:  Blood pressure (!) 142/73, pulse 82, temperature 97.9 F (36.6 C), temperature source Oral, resp. rate 20, height 5\' 5"  (1.651 m), weight 68.9 kg (152 lb), SpO2 99 %.  PHYSICAL EXAMINATION:  Physical Exam  GENERAL:  71 y.o.-year-old patient lying in the bed with no acute distress.  EYES: Pupils equal, round, reactive to light and accommodation. No scleral icterus. Extraocular muscles intact.  HEENT: Head atraumatic, normocephalic. Oropharynx and nasopharynx clear.  NECK:  Supple, no jugular venous distention. No thyroid enlargement, no tenderness.  LUNGS: Normal breath sounds bilaterally, no wheezing, rales,rhonchi or crepitation. No use of accessory muscles of respiration.    CARDIOVASCULAR: S1, S2 normal. No murmurs, rubs, or gallops.  ABDOMEN: Soft, nontender, nondistended. Bowel sounds present. No organomegaly or mass.  EXTREMITIES: No pedal edema, cyanosis, or clubbing.  NEUROLOGIC: Cranial nerves II through XII are intact. Muscle strength 5/5 in all extremities. Sensation intact. Gait not checked.  PSYCHIATRIC: The patient is alert and oriented x 3.  SKIN: No obvious rash, lesion, or ulcer.    LABORATORY PANEL:   CBC  Recent Labs Lab 07/03/17 0418  WBC 8.7  HGB 12.8  HCT 37.3  PLT 224   ------------------------------------------------------------------------------------------------------------------  Chemistries   Recent Labs Lab 07/03/17 0418  NA 142  K 4.0  CL 112*  CO2 25  GLUCOSE 130*  BUN 10  CREATININE 0.76  CALCIUM 9.3   ------------------------------------------------------------------------------------------------------------------  Cardiac Enzymes No results for input(s): TROPONINI in the last 168 hours. ------------------------------------------------------------------------------------------------------------------  RADIOLOGY:  Mr Lumbar Spine Wo Contrast  Result Date: 07/02/2017 CLINICAL DATA:  Worsening low back pain radiating into the right:  . EXAM: MRI LUMBAR SPINE WITHOUT CONTRAST TECHNIQUE: Multiplanar, multisequence MR imaging of the lumbar spine was performed. No intravenous contrast was administered. COMPARISON:  CT abdomen and pelvis dated July 02, 2017 ; lumbar spine MRI dated January 13, 2014. FINDINGS: Segmentation:  Standard. Alignment:  Trace anterolisthesis at L5-S1 is unchanged. Vertebrae:  No fracture, evidence of discitis, or bone lesion. Conus medullaris: Extends to the L1 level and appears normal. Paraspinal and other soft tissues: Negative. Disc levels: T12-L1:  Normal. L1-L2:  Normal. L2-L3:  Normal. L3-L4:  Normal. L4-L5: Diffuse disc bulge and mild bilateral facet arthropathy with ligamentum flavum  hypertrophy resulting in mild narrowing of the bilateral lateral recesses and possible impingement on the descending  left L5 nerve root. No spinal canal or neuroforaminal stenosis. L5-S1: Diffuse disc bulge and mild bilateral facet arthropathy without spinal canal or neuroforaminal stenosis. IMPRESSION: 1. 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 2. No high-grade spinal canal or neuroforaminal stenosis at any level. Electronically Signed   By: Titus Dubin M.D.   On: 07/02/2017 13:02   Mr Pelvis Wo Contrast  Result Date: 07/02/2017 CLINICAL DATA:  History of fall out of bed 2 weeks ago with a blow to the right side. Low back and right pelvic and groin pain. EXAM: MRI PELVIS WITHOUT CONTRAST TECHNIQUE: Multiplanar multisequence MR imaging of the pelvis was performed. No intravenous contrast was administered. COMPARISON:  CT abdomen and pelvis today. FINDINGS: Bone marrow signal is normal throughout. There is no fracture, stress change or focal lesion. No avascular necrosis of the femoral heads. No hip joint effusion. No notable degenerative change about the hips. The sacroiliac joints and symphysis pubis appear normal. All imaged muscles and tendons are intact. No evidence of bursitis. Imaged intrapelvic contents demonstrate wall thickening and edema about the sigmoid colon consistent with diverticulitis as seen on CT scan today. There is a small volume of free pelvic fluid. No hernia is identified. IMPRESSION: Sigmoid diverticulitis with an associated small volume of free pelvic fluid as seen on CT abdomen and pelvis earlier today. The exam is otherwise negative. Electronically Signed   By: Inge Rise M.D.   On: 07/02/2017 13:16   Ct Renal Stone Study  Result Date: 07/02/2017 CLINICAL DATA:  71 y/o F; injury to right side with bruising of right upper thigh with  ongoing and worsening pain of right hip, leg, and back. EXAM: CT ABDOMEN AND PELVIS WITHOUT CONTRAST TECHNIQUE: Multidetector CT imaging of the abdomen and pelvis was performed following the standard protocol without IV contrast. COMPARISON:  None. FINDINGS: Lower chest: No acute abnormality. Hepatobiliary: No focal liver abnormality is seen. No gallstones, gallbladder wall thickening, or biliary dilatation. Pancreas: Unremarkable. No pancreatic ductal dilatation or surrounding inflammatory changes. Spleen: Normal in size without focal abnormality. Adrenals/Urinary Tract: Adrenal glands are unremarkable. Kidneys are normal, without renal calculi, focal lesion, or hydronephrosis. Bladder is unremarkable. Stomach/Bowel: Normal stomach. Normal small bowel. Acute sigmoid diverticulitis. No evidence for perforation or abscess. Status post appendectomy. No bowel obstruction. Vascular/Lymphatic: Aortic atherosclerosis with minimal calcification. No enlarged abdominal or pelvic lymph nodes. Reproductive: Status post hysterectomy. No adnexal masses. Other: Small volume of fluid in the lower abdomen. Musculoskeletal: No acute or significant osseous findings. IMPRESSION: Acute sigmoid diverticulitis. No evidence for perforation or abscess. Electronically Signed   By: Kristine Garbe M.D.   On: 07/02/2017 04:09    EKG:  No orders found for this or any previous visit.  ASSESSMENT AND PLAN:   Lakhia Gengler  is a 71 y.o. female with a known history of  Skin cancer, arthritis presents to the hospital secondary to worsening lower back pain radiating down her right groin.  #1 Right hip pain- trauma about 2 weeks ago, none recently. Sudden onset of lower back pain with right hip pain radiating to the groin. Could be radicular symptoms -MRI of the lumbar spine and pelvis ordered. Lumbar spine MRI showing L4-L5, L5-S1 lateral recess narrowing and some impingement. No high-grade stenosis noted. -MRI of the pelvis  without any hip fractures or issues -Medrol dosepak started with much improvement. Outpatient orthopedics consult  recommended -Continue pain medications as needed and physical therapy today  #2 Acute diverticulitis- nausea and lower abdominal pain are improving. Low-grade fever yesterday. -Continue Cipro and Flagyl and gentle hydration today. -Able to tolerate diet today.  #3 Hyperlipidemia- continue fish oil capsules  #4 DVT Prophylaxis- on lovenox   Physical therapy to work with her today    All the records are reviewed and case discussed with Care Management/Social Workerr. Management plans discussed with the patient, family and they are in agreement.  CODE STATUS: Full code  TOTAL TIME TAKING CARE OF THIS PATIENT: 37 minutes.   POSSIBLE D/C tomorrow, DEPENDING ON CLINICAL CONDITION.   Bristol Osentoski M.D on 07/03/2017 at 9:43 AM  Between 7am to 6pm - Pager - [phone]  After 6pm go to [email or web address] - password [email or web address] [facility]  Office  [phone]  CC: Primary care physician; Mariana Arn, MD

## 2017-07-03 NOTE — Progress Notes (Signed)
IV site leaking.  MD Tressia Miners said to leave IV out (per patient request) if she is taking in good oral po/food

## 2017-07-04 MED ORDER — CIPROFLOXACIN HCL 500 MG PO TABS: 500.0000 mg | ORAL_TABLET | Freq: Two times a day (BID) | ORAL | 0 refills | Status: DC

## 2017-07-04 MED ORDER — BISACODYL 10 MG RE SUPP: 10.0000 mg | Freq: Every day | RECTAL | Status: DC | PRN

## 2017-07-04 MED ORDER — HYDROCODONE-ACETAMINOPHEN 5-325 MG PO TABS: 1.0000 | ORAL_TABLET | Freq: Four times a day (QID) | ORAL | 0 refills | Status: DC | PRN

## 2017-07-04 MED ORDER — METRONIDAZOLE 500 MG PO TABS: 500.0000 mg | ORAL_TABLET | Freq: Three times a day (TID) | ORAL | 0 refills | Status: DC

## 2017-07-04 MED ORDER — SENNA 8.6 MG PO TABS
1.0000 | ORAL_TABLET | Freq: Every day | ORAL | Status: DC
  Administered 2017-07-04: 8.6 mg via ORAL
  Filled 2017-07-04: qty 1

## 2017-07-04 MED ORDER — PROMETHAZINE HCL 25 MG RE SUPP: 25.0000 mg | Freq: Four times a day (QID) | RECTAL | 0 refills | Status: AC | PRN

## 2017-07-04 MED ORDER — POLYETHYLENE GLYCOL 3350 17 G PO PACK: 17.0000 g | PACK | Freq: Every day | ORAL | 0 refills | Status: DC | PRN

## 2017-07-04 MED ORDER — METHYLPREDNISOLONE 4 MG PO TBPK: ORAL_TABLET | ORAL | 0 refills | Status: DC

## 2017-07-04 MED ORDER — CYCLOBENZAPRINE HCL 5 MG PO TABS
5.0000 mg | ORAL_TABLET | Freq: Two times a day (BID) | ORAL | 0 refills | Status: DC
Start: 2017-07-04 — End: 2018-06-03

## 2017-07-04 NOTE — Discharge Summary (Signed)
Whitney Liu at [place] NAME: Whitney Liu    MR#:  [id number]  DATE OF BIRTH:  [birth year]  DATE OF ADMISSION:  07/02/2017   ADMITTING PHYSICIAN: Gladstone Lighter, MD  DATE OF DISCHARGE: 07/04/17  PRIMARY CARE PHYSICIAN: Mariana Arn, MD   ADMISSION DIAGNOSIS:   Diverticulitis [K57.92] Radiculopathy [M54.10] Right hip pain [M25.551] Acute right-sided low back pain with bilateral sciatica [M54.42, M54.41]  DISCHARGE DIAGNOSIS:   Active Problems:   Diverticulitis   SECONDARY DIAGNOSIS:   Past Medical History:  Diagnosis Date  . Arthritis   . Cancer ([place])    skin  . PONV (postoperative nausea and vomiting)     HOSPITAL COURSE:   Whitney Liu a 71 y.o. femalewith a known history of Skin cancer, arthritis presents to the hospital secondary to worsening lower back pain radiating down her right groin.  #1 Right hip pain- trauma about 2 weeks ago, none recently.  -Sudden onset of lower back pain with right hip pain radiating to the groin. Could be radicular symptoms -MRI of the lumbar spine and pelvis ordered. Lumbar spine MRI showing L4-L5, L5-S1 lateral recess narrowing and some impingement. No high-grade stenosis noted. -MRI of the pelvis without any hip fractures or issues -Medrol dosepak started with much improvement.  - Outpatient physical and rehabilitation medicine consult given. She follows with physiatrist. -Continue pain medications as needed and physical therapy -Able to ambulate well today  #2 Acute diverticulitis- occasional nausea. No abdominal pain. -Tolerating oral Cipro and Flagyl. We'll discharge on 1 more week  -Phenergan as needed at home -Able to tolerate diet . Advise to avoid constipation  #3 Hyperlipidemia- continue fish oil capsules   Ambulating well today. Will get outpatient appointments and discharge today   DISCHARGE CONDITIONS:   Guarded  CONSULTS OBTAINED:   None  DRUG  ALLERGIES:   Allergies  Allergen Reactions  . Codeine Nausea And Vomiting  . Sulfa Antibiotics Nausea And Vomiting   DISCHARGE MEDICATIONS:   Allergies as of 07/04/2017      Reactions   Codeine Nausea And Vomiting   Sulfa Antibiotics Nausea And Vomiting      Medication List    TAKE these medications   acetaminophen 325 MG tablet Commonly known as:  TYLENOL Take 650 mg by mouth every 6 (six) hours as needed.   calcium-vitamin D 250-100 MG-UNIT tablet Take 1 tablet by mouth 2 (two) times daily.   cetirizine 10 MG tablet Commonly known as:  ZYRTEC Take 10 mg by mouth daily.   ciprofloxacin 500 MG tablet Commonly known as:  CIPRO Take 1 tablet (500 mg total) by mouth 2 (two) times daily. X 7 days   cyclobenzaprine 5 MG tablet Commonly known as:  FLEXERIL Take 1 tablet (5 mg total) by mouth 2 (two) times daily.   Fish Oil 1200 MG Caps Take 1 capsule by mouth daily.   HYDROcodone-acetaminophen 5-325 MG tablet Commonly known as:  NORCO/VICODIN Take 1 tablet by mouth every 6 (six) hours as needed for moderate pain or severe pain.   meloxicam 15 MG tablet Commonly known as:  MOBIC Take 15 mg by mouth daily.   methylPREDNISolone 4 MG Tbpk tablet Commonly known as:  MEDROL Use as directed on the pack   metroNIDAZOLE 500 MG tablet Commonly known as:  FLAGYL Take 1 tablet (500 mg total) by mouth every 8 (eight) hours. X 7 days   multivitamin capsule Take 1 capsule by mouth daily.  polyethylene glycol packet Commonly known as:  MIRALAX / GLYCOLAX Take 17 g by mouth daily as needed for mild constipation.   promethazine 25 MG suppository Commonly known as:  PHENERGAN Place 1 suppository (25 mg total) rectally every 6 (six) hours as needed for nausea or vomiting.        DISCHARGE INSTRUCTIONS:   1. PCP follow-up in 1-2 weeks 2. Physiatrist follow-up in 1-2 weeks  DIET:   Cardiac diet  ACTIVITY:   Activity as tolerated  OXYGEN:   Home Oxygen: No.    Oxygen Delivery: room air  DISCHARGE LOCATION:   home   If you experience worsening of your admission symptoms, develop shortness of breath, life threatening emergency, suicidal or homicidal thoughts you must seek medical attention immediately by calling 911 or calling your MD immediately  if symptoms less severe.  You Must read complete instructions/literature along with all the possible adverse reactions/side effects for all the Medicines you take and that have been prescribed to you. Take any new Medicines after you have completely understood and accpet all the possible adverse reactions/side effects.   Please note  You were cared for by a hospitalist during your hospital stay. If you have any questions about your discharge medications or the care you received while you were in the hospital after you are discharged, you can call the unit and asked to speak with the hospitalist on call if the hospitalist that took care of you is not available. Once you are discharged, your primary care physician will handle any further medical issues. Please note that NO REFILLS for any discharge medications will be authorized once you are discharged, as it is imperative that you return to your primary care physician (or establish a relationship with a primary care physician if you do not have one) for your aftercare needs so that they can reassess your need for medications and monitor your lab values.    On the day of Discharge:  VITAL SIGNS:   Blood pressure (!) 155/77, pulse 82, temperature 98 F (36.7 C), temperature source Oral, resp. rate 19, height 5\' 5"  (1.651 m), weight 68.9 kg (152 lb), SpO2 97 %.  PHYSICAL EXAMINATION:    GENERAL:  71 y.o.-year-old patient lying in the bed with no acute distress.  EYES: Pupils equal, round, reactive to light and accommodation. No scleral icterus. Extraocular muscles intact.  HEENT: Head atraumatic, normocephalic. Oropharynx and nasopharynx clear.  NECK:   Supple, no jugular venous distention. No thyroid enlargement, no tenderness.  LUNGS: Normal breath sounds bilaterally, no wheezing, rales,rhonchi or crepitation. No use of accessory muscles of respiration.  CARDIOVASCULAR: S1, S2 normal. No murmurs, rubs, or gallops.  ABDOMEN: Soft, nontender, nondistended. Bowel sounds present. No organomegaly or mass.  EXTREMITIES: No pedal edema, cyanosis, or clubbing.  NEUROLOGIC: Cranial nerves II through XII are intact. Muscle strength 5/5 in all extremities. Sensation intact. Gait not checked.  PSYCHIATRIC: The patient is alert and oriented x 3.  SKIN: No obvious rash, lesion, or ulcer.    DATA REVIEW:   CBC  Recent Labs Lab 07/03/17 0418  WBC 8.7  HGB 12.8  HCT 37.3  PLT 224    Chemistries   Recent Labs Lab 07/03/17 0418  NA 142  K 4.0  CL 112*  CO2 25  GLUCOSE 130*  BUN 10  CREATININE 0.76  CALCIUM 9.3     Microbiology Results  No results found for this or any previous visit.  RADIOLOGY:  No results found.  Management plans discussed with the patient, family and they are in agreement.  CODE STATUS:     Code Status Orders        Start     Ordered   07/02/17 1003  Full code  Continuous     07/02/17 1002    Code Status History    Date Active Date Inactive Code Status Order ID Comments User Context   This patient has a current code status but no historical code status.      TOTAL TIME TAKING CARE OF THIS PATIENT: 37 minutes.    Gladstone Lighter M.D on 07/04/2017 at 9:26 AM  Between 7am to 6pm - Pager - [phone]  After 6pm go to [email or web address] - Proofreader  [facility]  Office  [phone]  CC: Primary care physician; Mariana Arn, MD   Note: This dictation was prepared with Dragon dictation along with smaller phrase technology. Any transcriptional errors that result from this process are unintentional.

## 2017-07-04 NOTE — Care Management Important Message (Signed)
Important Message  Patient Details  Name: Whitney Liu MRN: [id number] Date of Birth: [birth year]   Medicare Important Message Given:  N/A - LOS <3 / Initial given by admissions    Beverly Sessions, RN 07/04/2017, 10:18 AM

## 2017-07-04 NOTE — Progress Notes (Signed)
Discharge instructions reviewed with the patient.  She is requesting a rx for outpatient therapy which Dr Tressia Miners ordered for me.  She was concerned that her cheeks were slightly red.  Dr Tressia Miners aware.  No fevers.  No new orders

## 2017-07-08 NOTE — Discharge Instructions (Signed)

## 2017-07-10 ENCOUNTER — Encounter
Admission: RE | Disposition: A | Discharge status: Home / Self Care | Payer: Self-pay | Source: Ambulatory Visit | Attending: Ophthalmology

## 2017-07-10 ENCOUNTER — Ambulatory Visit: Payer: Medicare Other | Admitting: Anesthesiology

## 2017-07-10 ENCOUNTER — Ambulatory Visit
Admission: RE | Admit: 2017-07-10 | Discharge: 2017-07-10 | Disposition: A | Discharge status: Home / Self Care | Payer: Medicare Other | Source: Ambulatory Visit | Attending: Ophthalmology | Admitting: Ophthalmology

## 2017-07-10 DIAGNOSIS — Z85828 Personal history of other malignant neoplasm of skin: Secondary | ICD-10-CM | POA: Insufficient documentation

## 2017-07-10 DIAGNOSIS — Z791 Long term (current) use of non-steroidal anti-inflammatories (NSAID): Secondary | ICD-10-CM | POA: Insufficient documentation

## 2017-07-10 DIAGNOSIS — E7800 Pure hypercholesterolemia, unspecified: Secondary | ICD-10-CM | POA: Diagnosis not present

## 2017-07-10 DIAGNOSIS — H2511 Age-related nuclear cataract, right eye: Secondary | ICD-10-CM | POA: Diagnosis not present

## 2017-07-10 DIAGNOSIS — Z87891 Personal history of nicotine dependence: Secondary | ICD-10-CM | POA: Diagnosis not present

## 2017-07-10 DIAGNOSIS — Z79899 Other long term (current) drug therapy: Secondary | ICD-10-CM | POA: Insufficient documentation

## 2017-07-10 HISTORY — DX: Unspecified osteoarthritis, unspecified site: M19.90

## 2017-07-10 HISTORY — PX: CATARACT EXTRACTION W/PHACO: SHX586

## 2017-07-10 HISTORY — DX: Other specified postprocedural states: R11.2

## 2017-07-10 HISTORY — DX: Other specified postprocedural states: Z98.890

## 2017-07-10 SURGERY — PHACOEMULSIFICATION, CATARACT, WITH IOL INSERTION
Anesthesia: Monitor Anesthesia Care | Site: Eye | Laterality: Right | Wound class: Clean

## 2017-07-10 MED ORDER — MIDAZOLAM HCL 2 MG/2ML IJ SOLN
INTRAMUSCULAR | Status: DC | PRN
  Administered 2017-07-10: 2 mg via INTRAVENOUS

## 2017-07-10 MED ORDER — NA HYALUR & NA CHOND-NA HYALUR 0.4-0.35 ML IO KIT
PACK | INTRAOCULAR | Status: DC | PRN
  Administered 2017-07-10: 1 mL via INTRAOCULAR

## 2017-07-10 MED ORDER — LACTATED RINGERS IV SOLN: INTRAVENOUS | Status: DC

## 2017-07-10 MED ORDER — ACETAMINOPHEN 325 MG PO TABS: 325.0000 mg | ORAL_TABLET | ORAL | Status: DC | PRN

## 2017-07-10 MED ORDER — CEFUROXIME OPHTHALMIC INJECTION 1 MG/0.1 ML
INJECTION | OPHTHALMIC | Status: DC | PRN
  Administered 2017-07-10: .3 mL via OPHTHALMIC

## 2017-07-10 MED ORDER — EPINEPHRINE PF 1 MG/ML IJ SOLN
INTRAOCULAR | Status: DC | PRN
  Administered 2017-07-10: 61 mL via OPHTHALMIC

## 2017-07-10 MED ORDER — BALANCED SALT IO SOLN
INTRAOCULAR | Status: DC | PRN
  Administered 2017-07-10: 2 mL via OPHTHALMIC

## 2017-07-10 MED ORDER — ACETAMINOPHEN 160 MG/5ML PO SOLN: 325.0000 mg | ORAL | Status: DC | PRN

## 2017-07-10 MED ORDER — MOXIFLOXACIN HCL 0.5 % OP SOLN
1.0000 [drp] | OPHTHALMIC | Status: DC | PRN
  Administered 2017-07-10 (×3): 1 [drp] via OPHTHALMIC

## 2017-07-10 MED ORDER — FENTANYL CITRATE (PF) 100 MCG/2ML IJ SOLN
INTRAMUSCULAR | Status: DC | PRN
  Administered 2017-07-10: 50 ug via INTRAVENOUS

## 2017-07-10 MED ORDER — ARMC OPHTHALMIC DILATING DROPS
1.0000 "application " | OPHTHALMIC | Status: DC | PRN
  Administered 2017-07-10 (×3): 1 via OPHTHALMIC

## 2017-07-10 MED ORDER — BRIMONIDINE TARTRATE-TIMOLOL 0.2-0.5 % OP SOLN
OPHTHALMIC | Status: DC | PRN
  Administered 2017-07-10: 1 [drp] via OPHTHALMIC

## 2017-07-10 SURGICAL SUPPLY — 25 items
CANNULA ANT/CHMB 27GA (MISCELLANEOUS) ×2 IMPLANT
CARTRIDGE ABBOTT (MISCELLANEOUS) IMPLANT
GLOVE SURG LX 7.5 STRW (GLOVE) ×1
GLOVE SURG LX STRL 7.5 STRW (GLOVE) ×1 IMPLANT
GLOVE SURG TRIUMPH 8.0 PF LTX (GLOVE) ×2 IMPLANT
GOWN STRL REUS W/ TWL LRG LVL3 (GOWN DISPOSABLE) ×2 IMPLANT
GOWN STRL REUS W/TWL LRG LVL3 (GOWN DISPOSABLE) ×2
LENS IOL TECNIS ITEC 23.5 (Intraocular Lens) ×2 IMPLANT
MARKER SKIN DUAL TIP RULER LAB (MISCELLANEOUS) ×2 IMPLANT
NDL RETROBULBAR .5 NSTRL (NEEDLE) IMPLANT
NEEDLE FILTER BLUNT 18X 1/2SAF (NEEDLE) ×1
NEEDLE FILTER BLUNT 18X1 1/2 (NEEDLE) ×1 IMPLANT
PACK CATARACT BRASINGTON (MISCELLANEOUS) ×2 IMPLANT
PACK EYE AFTER SURG (MISCELLANEOUS) ×2 IMPLANT
PACK OPTHALMIC (MISCELLANEOUS) ×2 IMPLANT
RING MALYGIN 7.0 (MISCELLANEOUS) IMPLANT
SUT ETHILON 10-0 CS-B-6CS-B-6 (SUTURE)
SUT VICRYL  9 0 (SUTURE)
SUT VICRYL 9 0 (SUTURE) IMPLANT
SUTURE EHLN 10-0 CS-B-6CS-B-6 (SUTURE) IMPLANT
SYR 3ML LL SCALE MARK (SYRINGE) ×2 IMPLANT
SYR 5ML LL (SYRINGE) ×2 IMPLANT
SYR TB 1ML LUER SLIP (SYRINGE) ×2 IMPLANT
WATER STERILE IRR 250ML POUR (IV SOLUTION) ×2 IMPLANT
WIPE NON LINTING 3.25X3.25 (MISCELLANEOUS) ×2 IMPLANT

## 2017-07-10 NOTE — Op Note (Signed)
LOCATION:  [place]   PREOPERATIVE DIAGNOSIS:    Nuclear sclerotic cataract right eye. H25.11   POSTOPERATIVE DIAGNOSIS:  Nuclear sclerotic cataract right eye.     PROCEDURE:  Phacoemusification with posterior chamber intraocular lens placement of the right eye   LENS:   Implant Name Type Inv. Item Serial No. Manufacturer Lot No. LRB No. Used  LENS IOL DIOP 23.5 - [id number] Intraocular Lens LENS IOL DIOP 23.5 [id number] AMO   Right 1        ULTRASOUND TIME: 19 % of 1 minutes, 5 seconds.  CDE 12.6   SURGEON:  Wyonia Hough, MD   ANESTHESIA:  Topical with tetracaine drops and 2% Xylocaine jelly, augmented with 1% preservative-free intracameral lidocaine.    COMPLICATIONS:  None.   DESCRIPTION OF PROCEDURE:  The patient was identified in the holding room and transported to the operating room and placed in the supine position under the operating microscope.  The right eye was identified as the operative eye and it was prepped and draped in the usual sterile ophthalmic fashion.   A 1 millimeter clear-corneal paracentesis was made at the 12:00 position.  0.5 ml of preservative-free 1% lidocaine was injected into the anterior chamber. The anterior chamber was filled with Viscoat viscoelastic.  A 2.4 millimeter keratome was used to make a near-clear corneal incision at the 9:00 position.  A curvilinear capsulorrhexis was made with a cystotome and capsulorrhexis forceps.  Balanced salt solution was used to hydrodissect and hydrodelineate the nucleus.   Phacoemulsification was then used in stop and chop fashion to remove the lens nucleus and epinucleus.  The remaining cortex was then removed using the irrigation and aspiration handpiece. Provisc was then placed into the capsular bag to distend it for lens placement.  A lens was then injected into the capsular bag.  The remaining viscoelastic was aspirated.   Wounds were hydrated with balanced salt solution.  The anterior  chamber was inflated to a physiologic pressure with balanced salt solution.  No wound leaks were noted. Cefuroxime 0.1 ml of a 10mg /ml solution was injected into the anterior chamber for a dose of 1 mg of intracameral antibiotic at the completion of the case.   Timolol and Brimonidine drops were applied to the eye.  The patient was taken to the recovery room in stable condition without complications of anesthesia or surgery.   Yeng Frankie 07/10/2017, 10:45 AM

## 2017-07-10 NOTE — Anesthesia Preprocedure Evaluation (Signed)
Anesthesia Evaluation  Patient identified by MRN, date of birth, ID band Patient awake  General Assessment Comment:Hx of PONV  Reviewed: Allergy & Precautions, H&P , NPO status , Patient's Chart, lab work & pertinent test results, reviewed documented beta blocker date and time   History of Anesthesia Complications (+) PONV and history of anesthetic complications  Airway Mallampati: II  TM Distance: >3 FB Neck ROM: full    Dental no notable dental hx.    Pulmonary neg pulmonary ROS, former smoker,    Pulmonary exam normal breath sounds clear to auscultation       Cardiovascular Exercise Tolerance: Good negative cardio ROS Normal cardiovascular exam Rhythm:regular Rate:Normal     Neuro/Psych negative neurological ROS  negative psych ROS   GI/Hepatic negative GI ROS, Neg liver ROS,   Endo/Other  negative endocrine ROS  Renal/GU negative Renal ROS  negative genitourinary   Musculoskeletal   Abdominal   Peds  Hematology negative hematology ROS (+)   Anesthesia Other Findings   Reproductive/Obstetrics negative OB ROS                             Anesthesia Physical Anesthesia Plan  ASA: I  Anesthesia Plan: General and MAC   Post-op Pain Management:    Induction:   PONV Risk Score and Plan:   Airway Management Planned:   Additional Equipment:   Intra-op Plan:   Post-operative Plan:   Informed Consent: I have reviewed the patients History and Physical, chart, labs and discussed the procedure including the risks, benefits and alternatives for the proposed anesthesia with the patient or authorized representative who has indicated his/her understanding and acceptance.   Dental Advisory Given  Plan Discussed with: CRNA and Anesthesiologist  Anesthesia Plan Comments:         Anesthesia Quick Evaluation

## 2017-07-10 NOTE — Anesthesia Procedure Notes (Signed)
Procedure Name: MAC Performed by: Chalene Treu Pre-anesthesia Checklist: Patient identified, Emergency Drugs available, Suction available, Timeout performed and Patient being monitored Patient Re-evaluated:Patient Re-evaluated prior to inductionOxygen Delivery Method: Nasal cannula Placement Confirmation: positive ETCO2     

## 2017-07-10 NOTE — H&P (Signed)
The History and Physical notes are on paper, have been signed, and are to be scanned. The patient remains stable and unchanged from the H&P.   Previous H&P reviewed, patient examined, and there are no changes.  Devera Englander 07/10/2017 10:17 AM

## 2017-07-10 NOTE — Anesthesia Postprocedure Evaluation (Signed)
Anesthesia Post Note  Patient: Whitney Liu  Procedure(s) Performed: Procedure(s) (LRB): CATARACT EXTRACTION PHACO AND INTRAOCULAR LENS PLACEMENT (IOC) (Right)  Patient location during evaluation: PACU Anesthesia Type: MAC Level of consciousness: awake and alert Pain management: pain level controlled Vital Signs Assessment: post-procedure vital signs reviewed and stable Respiratory status: spontaneous breathing, nonlabored ventilation and respiratory function stable Cardiovascular status: stable and blood pressure returned to baseline Anesthetic complications: no    Trecia Rogers

## 2017-07-10 NOTE — Transfer of Care (Signed)
Immediate Anesthesia Transfer of Care Note  Patient: Whitney Liu  Procedure(s) Performed: Procedure(s) with comments: CATARACT EXTRACTION PHACO AND INTRAOCULAR LENS PLACEMENT (IOC) (Right) - IVA TOPICAL RIGHT  Patient Location: PACU  Anesthesia Type: General, MAC  Level of Consciousness: awake, alert  and patient cooperative  Airway and Oxygen Therapy: Patient Spontanous Breathing and Patient connected to supplemental oxygen  Post-op Assessment: Post-op Vital signs reviewed, Patient's Cardiovascular Status Stable, Respiratory Function Stable, Patent Airway and No signs of Nausea or vomiting  Post-op Vital Signs: Reviewed and stable  Complications: No apparent anesthesia complications

## 2017-07-11 ENCOUNTER — Encounter: Payer: Self-pay | Admitting: Ophthalmology

## 2018-03-18 ENCOUNTER — Other Ambulatory Visit: Payer: Self-pay | Admitting: Family Medicine

## 2018-03-18 DIAGNOSIS — Z1231 Encounter for screening mammogram for malignant neoplasm of breast: Secondary | ICD-10-CM

## 2018-05-09 ENCOUNTER — Ambulatory Visit
Admission: RE | Admit: 2018-05-09 | Discharge: 2018-05-09 | Disposition: A | Discharge status: Home / Self Care | Payer: Medicare Other | Source: Ambulatory Visit | Attending: Family Medicine | Admitting: Family Medicine

## 2018-05-09 DIAGNOSIS — Z1231 Encounter for screening mammogram for malignant neoplasm of breast: Secondary | ICD-10-CM

## 2018-06-02 NOTE — Discharge Instructions (Signed)

## 2018-06-03 ENCOUNTER — Other Ambulatory Visit: Payer: Self-pay

## 2018-06-03 ENCOUNTER — Encounter: Payer: Self-pay | Admitting: *Deleted

## 2018-06-10 ENCOUNTER — Ambulatory Visit
Admission: RE | Admit: 2018-06-10 | Discharge: 2018-06-10 | Disposition: A | Discharge status: Home / Self Care | Payer: Medicare Other | Source: Ambulatory Visit | Attending: Ophthalmology | Admitting: Ophthalmology

## 2018-06-10 ENCOUNTER — Encounter
Admission: RE | Disposition: A | Discharge status: Home / Self Care | Payer: Self-pay | Source: Ambulatory Visit | Attending: Ophthalmology

## 2018-06-10 ENCOUNTER — Ambulatory Visit: Payer: Medicare Other | Admitting: Anesthesiology

## 2018-06-10 DIAGNOSIS — H2512 Age-related nuclear cataract, left eye: Secondary | ICD-10-CM | POA: Insufficient documentation

## 2018-06-10 DIAGNOSIS — Z87891 Personal history of nicotine dependence: Secondary | ICD-10-CM | POA: Diagnosis not present

## 2018-06-10 DIAGNOSIS — E7800 Pure hypercholesterolemia, unspecified: Secondary | ICD-10-CM | POA: Insufficient documentation

## 2018-06-10 HISTORY — PX: CATARACT EXTRACTION W/PHACO: SHX586

## 2018-06-10 SURGERY — PHACOEMULSIFICATION, CATARACT, WITH IOL INSERTION
Anesthesia: Monitor Anesthesia Care | Site: Eye | Laterality: Left | Wound class: "Clean "

## 2018-06-10 MED ORDER — CYCLOPENTOLATE HCL 2 % OP SOLN
1.0000 [drp] | OPHTHALMIC | Status: DC | PRN
  Administered 2018-06-10 (×4): 1 [drp] via OPHTHALMIC

## 2018-06-10 MED ORDER — MIDAZOLAM HCL 2 MG/2ML IJ SOLN
INTRAMUSCULAR | Status: DC | PRN
  Administered 2018-06-10: 2 mg via INTRAVENOUS

## 2018-06-10 MED ORDER — MOXIFLOXACIN HCL 0.5 % OP SOLN
1.0000 [drp] | OPHTHALMIC | Status: DC | PRN
  Administered 2018-06-10 (×3): 1 [drp] via OPHTHALMIC

## 2018-06-10 MED ORDER — TETRACAINE HCL 0.5 % OP SOLN
1.0000 [drp] | OPHTHALMIC | Status: DC | PRN
  Administered 2018-06-10 (×2): 1 [drp] via OPHTHALMIC

## 2018-06-10 MED ORDER — ACETAMINOPHEN 325 MG PO TABS: 325.0000 mg | ORAL_TABLET | ORAL | Status: DC | PRN

## 2018-06-10 MED ORDER — BRIMONIDINE TARTRATE-TIMOLOL 0.2-0.5 % OP SOLN
OPHTHALMIC | Status: DC | PRN
  Administered 2018-06-10: 1 [drp] via OPHTHALMIC

## 2018-06-10 MED ORDER — CEFUROXIME OPHTHALMIC INJECTION 1 MG/0.1 ML
INJECTION | OPHTHALMIC | Status: DC | PRN
  Administered 2018-06-10: .3 mL via OPHTHALMIC

## 2018-06-10 MED ORDER — LACTATED RINGERS IV SOLN: 1000.0000 mL | INTRAVENOUS | Status: DC

## 2018-06-10 MED ORDER — LIDOCAINE HCL (PF) 2 % IJ SOLN
INTRAOCULAR | Status: DC | PRN
  Administered 2018-06-10: 1 mL via INTRAMUSCULAR

## 2018-06-10 MED ORDER — EPINEPHRINE PF 1 MG/ML IJ SOLN
INTRAOCULAR | Status: DC | PRN
  Administered 2018-06-10: 50 mL via OPHTHALMIC

## 2018-06-10 MED ORDER — PHENYLEPHRINE HCL 10 % OP SOLN
1.0000 [drp] | OPHTHALMIC | Status: DC | PRN
  Administered 2018-06-10 (×4): 1 [drp] via OPHTHALMIC

## 2018-06-10 MED ORDER — NA HYALUR & NA CHOND-NA HYALUR 0.4-0.35 ML IO KIT
PACK | INTRAOCULAR | Status: DC | PRN
  Administered 2018-06-10: 1 mL via INTRAOCULAR

## 2018-06-10 MED ORDER — FENTANYL CITRATE (PF) 100 MCG/2ML IJ SOLN
INTRAMUSCULAR | Status: DC | PRN
  Administered 2018-06-10: 50 ug via INTRAVENOUS

## 2018-06-10 MED ORDER — ACETAMINOPHEN 160 MG/5ML PO SOLN: 325.0000 mg | ORAL | Status: DC | PRN

## 2018-06-10 SURGICAL SUPPLY — 27 items

## 2018-06-10 NOTE — Anesthesia Procedure Notes (Signed)
Procedure Name: MAC Date/Time: 06/10/2018 9:49 AM Performed by: Janna Arch, CRNA Pre-anesthesia Checklist: Emergency Drugs available, Patient identified, Suction available and Patient being monitored Patient Re-evaluated:Patient Re-evaluated prior to induction Oxygen Delivery Method: Nasal cannula

## 2018-06-10 NOTE — Op Note (Signed)
OPERATIVE NOTE  Whitney Liu [id number] 06/10/2018   PREOPERATIVE DIAGNOSIS:  Nuclear sclerotic cataract left eye. H25.12   POSTOPERATIVE DIAGNOSIS:    Nuclear sclerotic cataract left eye.     PROCEDURE:  Phacoemusification with posterior chamber intraocular lens placement of the left eye   LENS:   Implant Name Type Inv. Item Serial No. Manufacturer Lot No. LRB No. Used  LENS IOL DIOP 23.0 - [id number] Intraocular Lens LENS IOL DIOP 23.0 [id number] AMO  Left 1        ULTRASOUND TIME: 15  % of 0 minutes 46 seconds, CDE 7.1  SURGEON:  Wyonia Hough, MD   ANESTHESIA:  Topical with tetracaine drops and 2% Xylocaine jelly, augmented with 1% preservative-free intracameral lidocaine.    COMPLICATIONS:  None.   DESCRIPTION OF PROCEDURE:  The patient was identified in the holding room and transported to the operating room and placed in the supine position under the operating microscope.  The left eye was identified as the operative eye and it was prepped and draped in the usual sterile ophthalmic fashion.   A 1 millimeter clear-corneal paracentesis was made at the 1:30 position.  0.5 ml of preservative-free 1% lidocaine was injected into the anterior chamber.  The anterior chamber was filled with Viscoat viscoelastic.  A 2.4 millimeter keratome was used to make a near-clear corneal incision at the 10:30 position.  .  A curvilinear capsulorrhexis was made with a cystotome and capsulorrhexis forceps.  Balanced salt solution was used to hydrodissect and hydrodelineate the nucleus.   Phacoemulsification was then used in stop and chop fashion to remove the lens nucleus and epinucleus.  The remaining cortex was then removed using the irrigation and aspiration handpiece. Provisc was then placed into the capsular bag to distend it for lens placement.  A lens was then injected into the capsular bag.  The remaining viscoelastic was aspirated.   Wounds were hydrated with balanced salt solution.   The anterior chamber was inflated to a physiologic pressure with balanced salt solution.  No wound leaks were noted. Cefuroxime 0.1 ml of a 10mg /ml solution was injected into the anterior chamber for a dose of 1 mg of intracameral antibiotic at the completion of the case.   Timolol and Brimonidine drops were applied to the eye.  The patient was taken to the recovery room in stable condition without complications of anesthesia or surgery.  Whitney Liu 06/10/2018, 10:05 AM

## 2018-06-10 NOTE — Anesthesia Preprocedure Evaluation (Addendum)
Anesthesia Evaluation  Patient identified by MRN, date of birth, ID band Patient awake    Reviewed: Allergy & Precautions, NPO status , Patient's Chart, lab work & pertinent test results, reviewed documented beta blocker date and time   History of Anesthesia Complications (+) PONV and history of anesthetic complications (Did not have PONV with last cataract surgery)  Airway Mallampati: II  TM Distance: >3 FB Neck ROM: Full    Dental no notable dental hx.    Pulmonary former smoker,    Pulmonary exam normal breath sounds clear to auscultation       Cardiovascular negative cardio ROS Normal cardiovascular exam Rhythm:Regular Rate:Normal     Neuro/Psych negative neurological ROS     GI/Hepatic negative GI ROS, Neg liver ROS,   Endo/Other  negative endocrine ROS  Renal/GU negative Renal ROS     Musculoskeletal  (+) Arthritis ,   Abdominal Normal abdominal exam  (+)   Peds  Hematology   Anesthesia Other Findings   Reproductive/Obstetrics negative OB ROS                            Anesthesia Physical Anesthesia Plan  ASA: II  Anesthesia Plan: MAC   Post-op Pain Management:    Induction:   PONV Risk Score and Plan:   Airway Management Planned: Natural Airway  Additional Equipment: None  Intra-op Plan:   Post-operative Plan:   Informed Consent: I have reviewed the patients History and Physical, chart, labs and discussed the procedure including the risks, benefits and alternatives for the proposed anesthesia with the patient or authorized representative who has indicated his/her understanding and acceptance.     Plan Discussed with: CRNA, Anesthesiologist and Surgeon  Anesthesia Plan Comments:         Anesthesia Quick Evaluation

## 2018-06-10 NOTE — H&P (Signed)
The History and Physical notes are on paper, have been signed, and are to be scanned. The patient remains stable and unchanged from the H&P.   Previous H&P reviewed, patient examined, and there are no changes.  Whitney Liu 06/10/2018 9:21 AM

## 2018-06-10 NOTE — Transfer of Care (Signed)
Immediate Anesthesia Transfer of Care Note  Patient: Whitney Liu  Procedure(s) Performed: CATARACT EXTRACTION PHACO AND INTRAOCULAR LENS PLACEMENT (IOC) LEFT IVA TOPICAL (Left Eye)  Patient Location: PACU  Anesthesia Type: MAC  Level of Consciousness: awake, alert  and patient cooperative  Airway and Oxygen Therapy: Patient Spontanous Breathing and Patient connected to supplemental oxygen  Post-op Assessment: Post-op Vital signs reviewed, Patient's Cardiovascular Status Stable, Respiratory Function Stable, Patent Airway and No signs of Nausea or vomiting  Post-op Vital Signs: Reviewed and stable  Complications: No apparent anesthesia complications

## 2018-06-10 NOTE — Anesthesia Postprocedure Evaluation (Signed)
Anesthesia Post Note  Patient: Whitney Liu  Procedure(s) Performed: CATARACT EXTRACTION PHACO AND INTRAOCULAR LENS PLACEMENT (IOC) LEFT IVA TOPICAL (Left Eye)  Patient location during evaluation: PACU Anesthesia Type: MAC Level of consciousness: awake Pain management: pain level controlled Vital Signs Assessment: post-procedure vital signs reviewed and stable Respiratory status: spontaneous breathing Cardiovascular status: blood pressure returned to baseline Postop Assessment: no headache Anesthetic complications: no    Lavonna Monarch

## 2018-06-11 ENCOUNTER — Encounter: Payer: Self-pay | Admitting: Ophthalmology

## 2019-07-08 ENCOUNTER — Other Ambulatory Visit: Payer: Self-pay | Admitting: Family Medicine

## 2019-07-13 ENCOUNTER — Other Ambulatory Visit: Payer: Self-pay | Admitting: Family Medicine

## 2019-07-13 DIAGNOSIS — Z1231 Encounter for screening mammogram for malignant neoplasm of breast: Secondary | ICD-10-CM

## 2019-08-21 ENCOUNTER — Other Ambulatory Visit: Payer: Self-pay

## 2019-08-21 ENCOUNTER — Ambulatory Visit
Admission: RE | Admit: 2019-08-21 | Discharge: 2019-08-21 | Disposition: A | Discharge status: Home / Self Care | Payer: Medicare Other | Source: Ambulatory Visit | Attending: Family Medicine | Admitting: Family Medicine

## 2019-08-21 DIAGNOSIS — Z1231 Encounter for screening mammogram for malignant neoplasm of breast: Secondary | ICD-10-CM | POA: Diagnosis not present

## 2019-10-21 ENCOUNTER — Other Ambulatory Visit: Payer: Self-pay | Admitting: Physical Medicine and Rehabilitation

## 2019-10-21 DIAGNOSIS — M5442 Lumbago with sciatica, left side: Secondary | ICD-10-CM

## 2019-11-04 ENCOUNTER — Ambulatory Visit
Admission: RE | Admit: 2019-11-04 | Discharge: 2019-11-04 | Disposition: A | Discharge status: Home / Self Care | Payer: Medicare Other | Source: Ambulatory Visit | Attending: Physical Medicine and Rehabilitation | Admitting: Physical Medicine and Rehabilitation

## 2019-11-04 ENCOUNTER — Other Ambulatory Visit: Payer: Self-pay

## 2019-11-04 DIAGNOSIS — M5442 Lumbago with sciatica, left side: Secondary | ICD-10-CM | POA: Diagnosis not present

## 2019-11-10 ENCOUNTER — Other Ambulatory Visit: Payer: Self-pay | Admitting: Family Medicine

## 2019-11-10 DIAGNOSIS — M25552 Pain in left hip: Secondary | ICD-10-CM

## 2019-11-12 ENCOUNTER — Ambulatory Visit
Admission: RE | Admit: 2019-11-12 | Discharge: 2019-11-12 | Disposition: A | Discharge status: Home / Self Care | Payer: Medicare Other | Source: Ambulatory Visit | Attending: Family Medicine | Admitting: Family Medicine

## 2019-11-12 ENCOUNTER — Other Ambulatory Visit: Payer: Self-pay

## 2019-11-12 DIAGNOSIS — M25552 Pain in left hip: Secondary | ICD-10-CM

## 2019-11-12 MED ORDER — IOPAMIDOL (ISOVUE-M 200) INJECTION 41%
1.0000 mL | Freq: Once | INTRAMUSCULAR | Status: AC
  Administered 2019-11-12: 11:00:00 1 mL via INTRA_ARTICULAR

## 2019-11-12 MED ORDER — METHYLPREDNISOLONE ACETATE 40 MG/ML INJ SUSP (RADIOLOG
120.0000 mg | Freq: Once | INTRAMUSCULAR | Status: AC
  Administered 2019-11-12: 120 mg via INTRA_ARTICULAR

## 2020-01-21 IMAGING — MG MM DIGITAL SCREENING BILAT W/ TOMO W/ CAD
8 series · 8 of 24 positions shown · non-contrast
Comparison: Previous exam(s).

CLINICAL DATA: Screening.

EXAM:
DIGITAL SCREENING BILATERAL MAMMOGRAM WITH TOMO AND CAD

[L CC synth-2D]
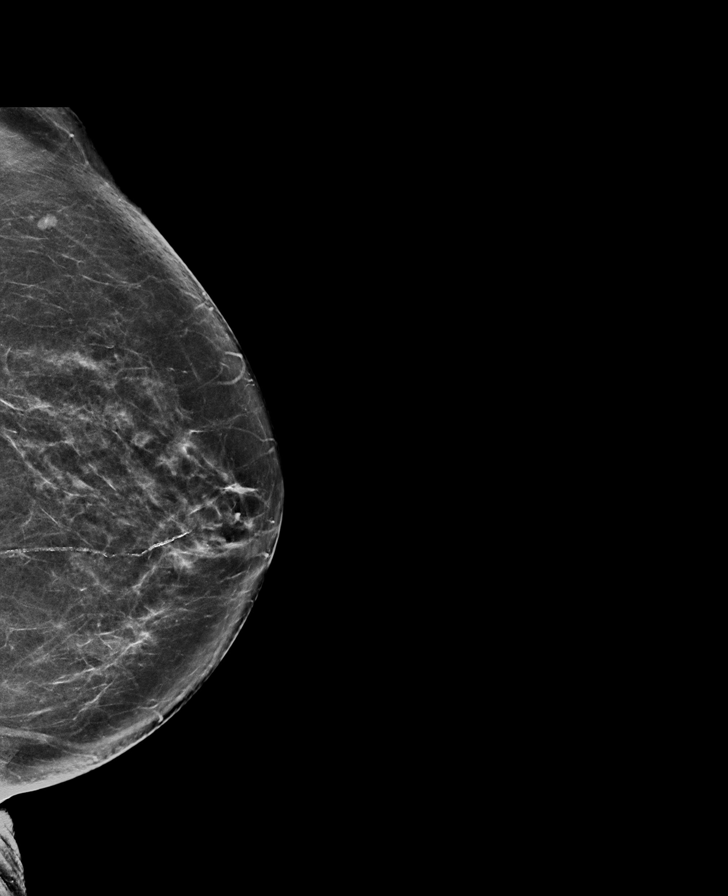

[R CC synth-2D]
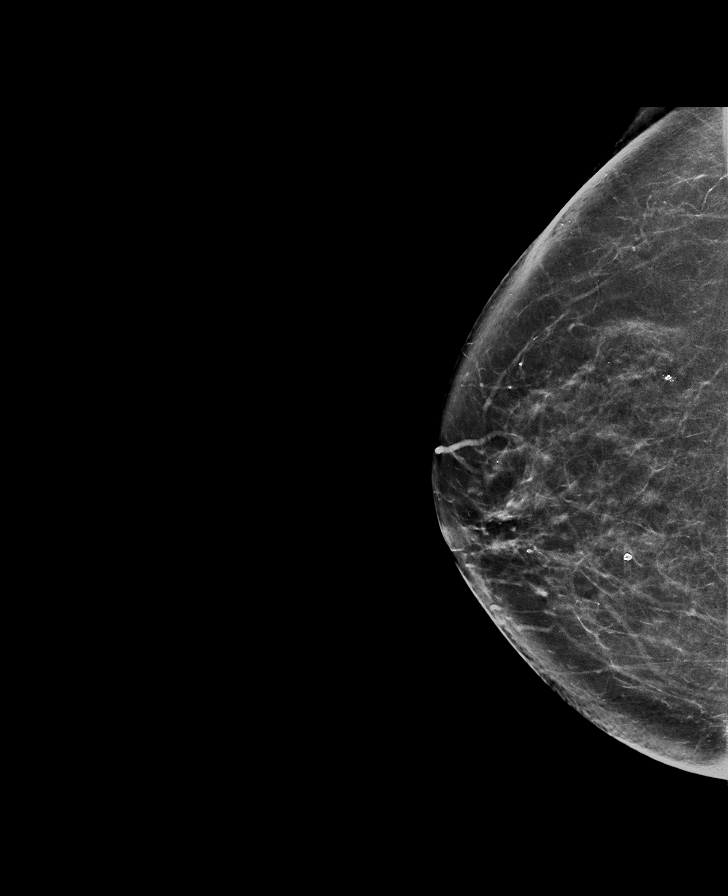

[L MLO synth-2D]
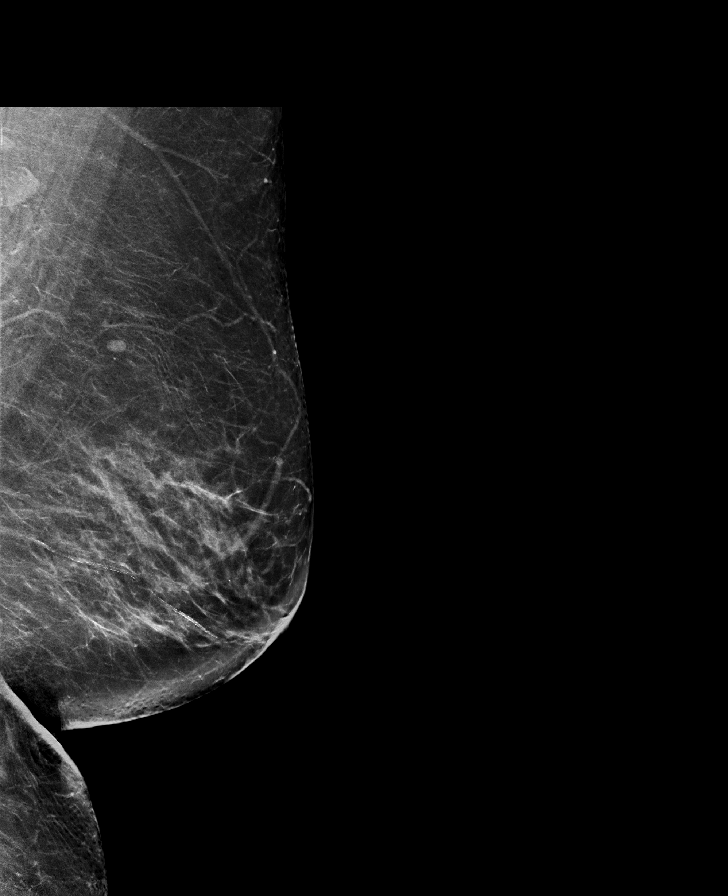

[R MLO synth-2D]
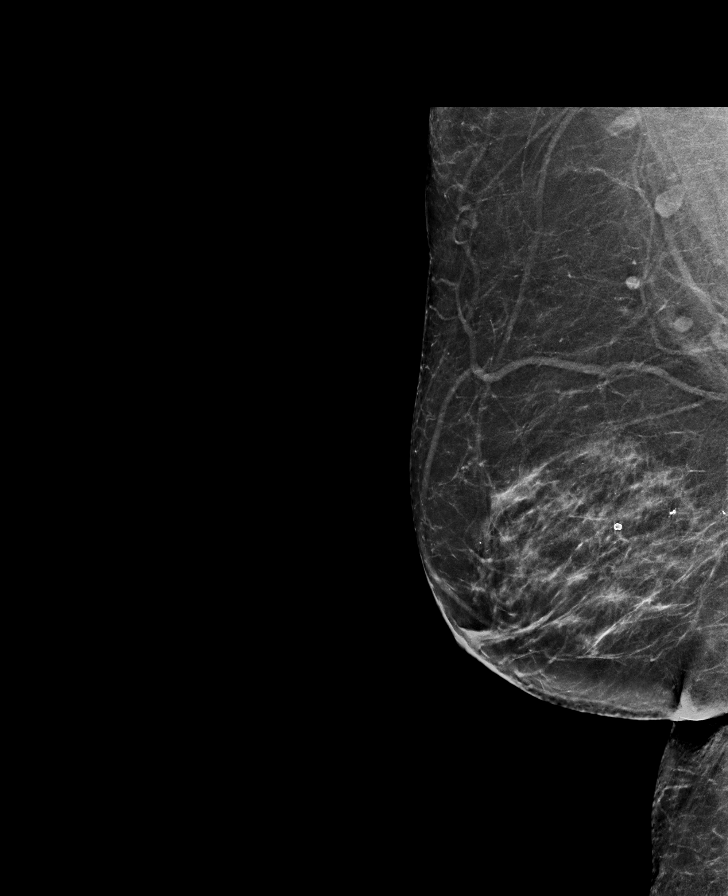

[L CC tomo · tomo slice 39/76.0]
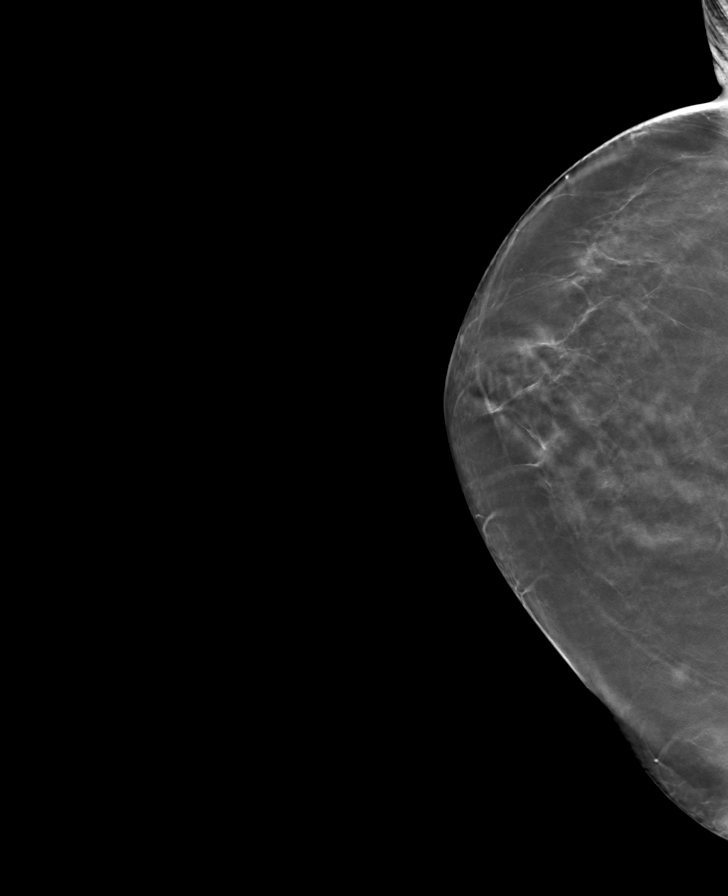

[L MLO tomo · tomo slice 40/79.0]
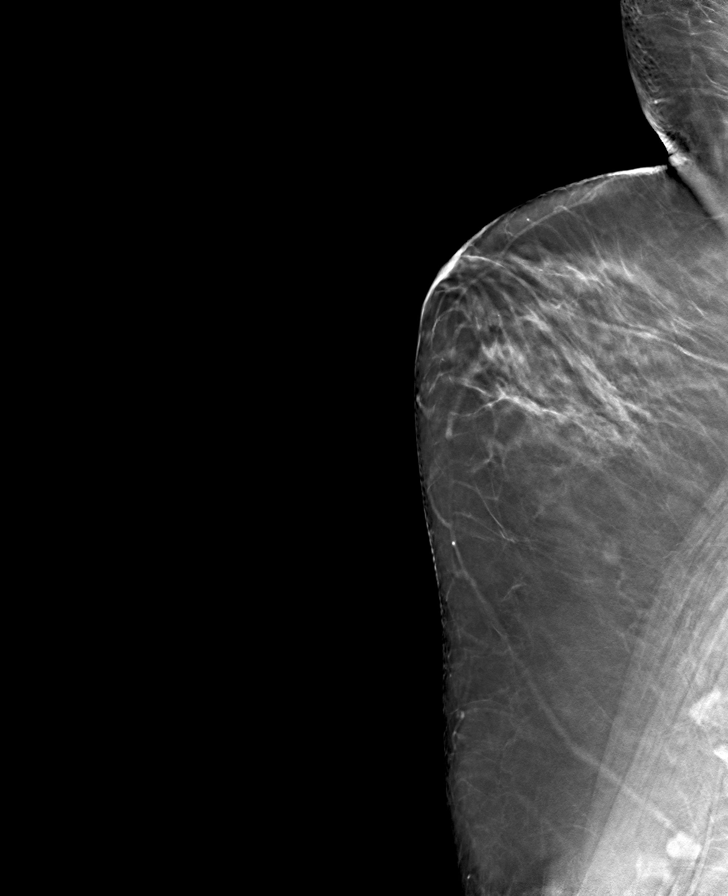

[R MLO tomo · tomo slice 38/75.0]
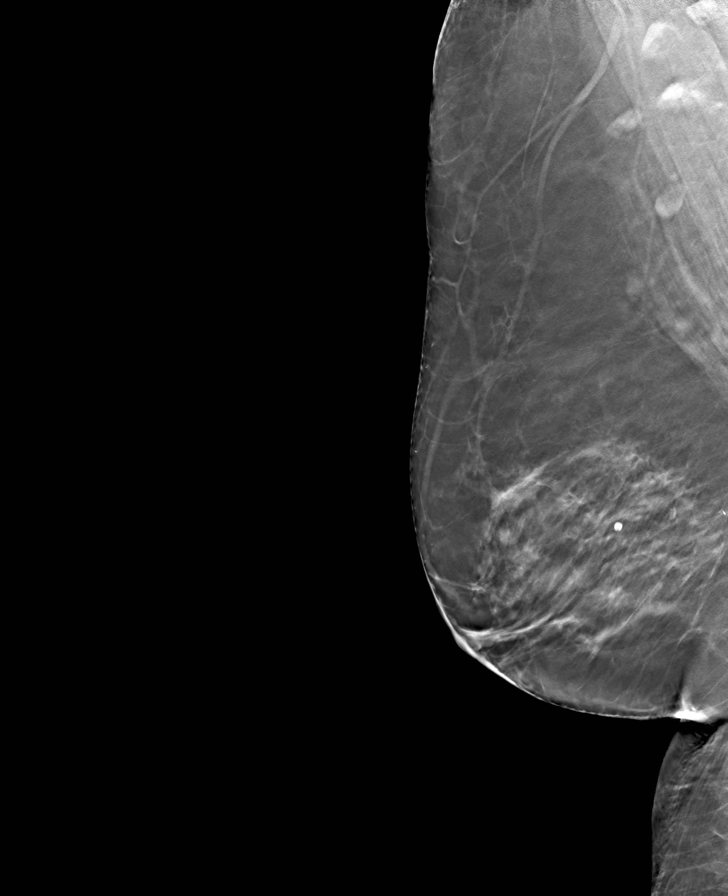

[R CC tomo · tomo slice 39/77.0]
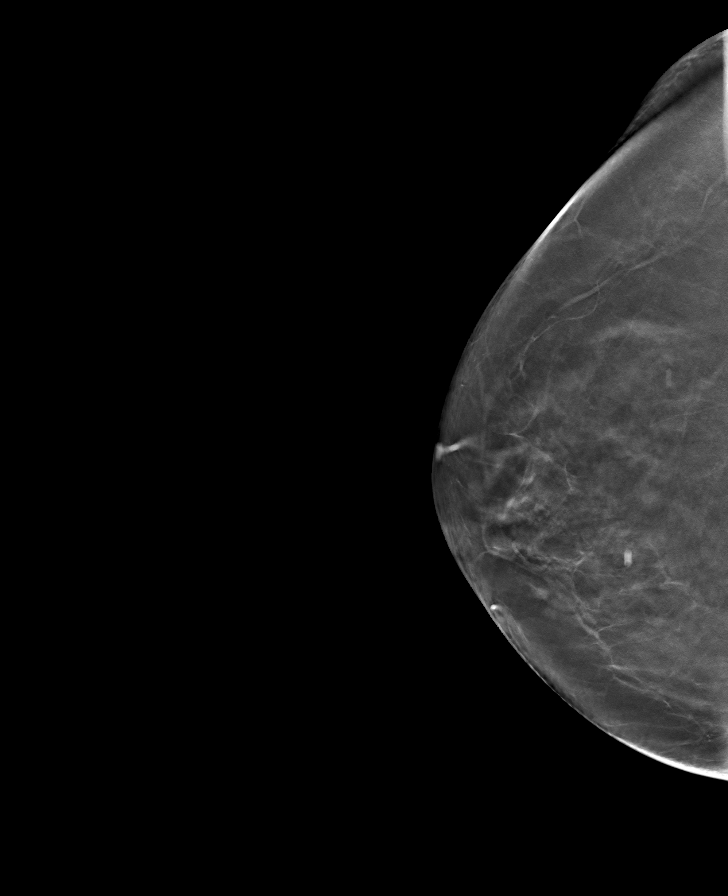

[8 of 24 positions shown; findings below may reference images not displayed]

ACR Breast Density Category b: There are scattered areas of
fibroglandular density.
FINDINGS: There are no findings suspicious for malignancy. Images were
processed with CAD.
IMPRESSION: No mammographic evidence of malignancy. A result letter of this
screening mammogram will be mailed directly to the patient.

RECOMMENDATION:
Screening mammogram in one year. (Code:[id number])

BI-RADS CATEGORY  1: Negative.

## 2020-01-25 ENCOUNTER — Other Ambulatory Visit: Payer: Self-pay | Admitting: Family Medicine

## 2020-07-28 ENCOUNTER — Other Ambulatory Visit: Payer: Self-pay | Admitting: Family Medicine

## 2020-07-28 DIAGNOSIS — Z1231 Encounter for screening mammogram for malignant neoplasm of breast: Secondary | ICD-10-CM

## 2020-08-24 ENCOUNTER — Other Ambulatory Visit: Payer: Self-pay

## 2020-08-24 ENCOUNTER — Ambulatory Visit
Admission: RE | Admit: 2020-08-24 | Discharge: 2020-08-24 | Disposition: A | Discharge status: Home / Self Care | Payer: Medicare PPO | Source: Ambulatory Visit | Attending: Family Medicine | Admitting: Family Medicine

## 2020-08-24 DIAGNOSIS — Z1231 Encounter for screening mammogram for malignant neoplasm of breast: Secondary | ICD-10-CM

## 2020-09-28 ENCOUNTER — Ambulatory Visit: Payer: Medicare PPO | Admitting: Dermatology

## 2020-09-28 ENCOUNTER — Encounter: Payer: Self-pay | Admitting: Dermatology

## 2020-09-28 ENCOUNTER — Other Ambulatory Visit: Payer: Self-pay

## 2020-09-28 DIAGNOSIS — L820 Inflamed seborrheic keratosis: Secondary | ICD-10-CM | POA: Diagnosis not present

## 2020-09-28 DIAGNOSIS — D485 Neoplasm of uncertain behavior of skin: Secondary | ICD-10-CM

## 2020-09-28 DIAGNOSIS — Z85828 Personal history of other malignant neoplasm of skin: Secondary | ICD-10-CM

## 2020-09-28 DIAGNOSIS — D492 Neoplasm of unspecified behavior of bone, soft tissue, and skin: Secondary | ICD-10-CM

## 2020-09-28 NOTE — Patient Instructions (Addendum)
Wound Care Instructions  1. Cleanse wound gently with soap and water once a day then pat dry with clean gauze. Apply a thing coat of Petrolatum (petroleum jelly, "Vaseline") over the wound (unless you have an allergy to this). We recommend that you use a new, sterile tube of Vaseline. Do not pick or remove scabs. Do not remove the yellow or white "healing tissue" from the base of the wound.  2. Cover the wound with fresh, clean, nonstick gauze and secure with paper tape. You may use Band-Aids in place of gauze and tape if the would is small enough, but would recommend trimming much of the tape off as there is often too much. Sometimes Band-Aids can irritate the skin.  3. You should call the office for your biopsy report after 1 week if you have not already been contacted.  4. If you experience any problems, such as abnormal amounts of bleeding, swelling, significant bruising, significant pain, or evidence of infection, please call the office immediately.  5. FOR ADULT SURGERY PATIENTS: If you need something for pain relief you may take 1 extra strength Tylenol (acetaminophen) AND 2 Ibuprofen (200mg each) together every 4 hours as needed for pain. (do not take these if you are allergic to them or if you have a reason you should not take them.) Typically, you may only need pain medication for 1 to 3 days.   Cryotherapy Aftercare  . Wash gently with soap and water everyday.   . Apply Vaseline and Band-Aid daily until healed.  Prior to procedure, discussed risks of blister formation, small wound, skin dyspigmentation, or rare scar following cryotherapy.   

## 2020-09-28 NOTE — Progress Notes (Signed)
Follow-Up Visit   Subjective  Whitney Liu is a 74 y.o. female who presents for the following: Skin Problem (Lesion at right chest. Dur: 2-3 weeks. Crusty, hard lesion. Red, irritated. ) and Skin Cancer (Right post. thigh. BCC. [facility] 02/08/2020. Most recent skin cancer.).    The following portions of the chart were reviewed this encounter and updated as appropriate:     Review of Systems: No other skin or systemic complaints except as noted in HPI or Assessment and Plan.  Objective  Well appearing patient in no apparent distress; mood and affect are within normal limits.  A focused examination was performed including face, chest, right thigh. Relevant physical exam findings are noted in the Assessment and Plan.  Objective  Right upper chest: 74mm pink keratotic papule  Objective  Left Forearm - Posterior x1: Erythematous keratotic or waxy stuck-on papule   Assessment & Plan    History of Basal Cell Carcinoma of the Skin at left nasal canthus and right post. thigh - No evidence of recurrence today - Recommend regular full body skin exams - Recommend daily broad spectrum sunscreen SPF 30+ to sun-exposed areas, reapply every 2 hours as needed.  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 Right thigh. - No evidence of recurrence today - Recommend regular full body skin exams - Recommend daily broad spectrum sunscreen SPF 30+ to sun-exposed areas, reapply every 2 hours as needed.  - Call if any new or changing lesions are noted between office visits  Neoplasm of skin Right upper chest  Skin / nail biopsy Type of biopsy: tangential   Informed consent: discussed and consent obtained   Anesthesia: the lesion was anesthetized in a standard fashion   Anesthesia comment:  Area prepped with alcohol Anesthetic:  1% lidocaine w/ epinephrine 1-100,000 buffered w/ 8.4%  NaHCO3 Instrument used: flexible razor blade   Hemostasis achieved with: pressure, aluminum chloride and electrodesiccation   Outcome: patient tolerated procedure well    Destruction of lesion  Destruction method: electrodesiccation and curettage   Timeout:  patient name, date of birth, surgical site, and procedure verified Anesthesia: the lesion was anesthetized in a standard fashion   Anesthetic:  1% lidocaine w/ epinephrine 1-100,000 buffered w/ 8.4% NaHCO3 Curettage performed in three different directions: Yes   Electrodesiccation performed over the curetted area: Yes   Final wound size (cm):  0.6 Hemostasis achieved with:  pressure, aluminum chloride and electrodesiccation Outcome: patient tolerated procedure well with no complications   Post-procedure details: wound care instructions given   Additional details:  Mupirocin ointment and Bandaid applied    Specimen 1 - Surgical pathology Differential Diagnosis: Hypertrophic AK vs ISK, R/O SCC Check Margins: No 32mm pink keratotic papule  Hypertrophic AK vs ISK, R/O SCC  Inflamed seborrheic keratosis Left Forearm - Posterior x1  Destruction of lesion - Left Forearm - Posterior x1  Destruction method: cryotherapy   Informed consent: discussed and consent obtained   Lesion destroyed using liquid nitrogen: Yes   Region frozen until ice ball extended beyond lesion: Yes   Outcome: patient tolerated procedure well with no complications   Post-procedure details: wound care instructions given    Return for as scheduled for skin check.   I, Emelia Salisbury, CMA, am acting as scribe for Brendolyn Patty, MD.  Documentation: I have reviewed the above documentation for accuracy and completeness, and  I agree with the above.  Brendolyn Patty MD

## 2020-10-05 ENCOUNTER — Telehealth: Payer: Self-pay

## 2020-10-05 NOTE — Telephone Encounter (Signed)
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

## 2020-10-05 NOTE — Telephone Encounter (Signed)
Advised patient biopsy on the chest was a thick AK with features of a wart. Already treated with [facility].

## 2021-02-20 ENCOUNTER — Encounter: Payer: Medicare Other | Admitting: Dermatology

## 2021-03-29 ENCOUNTER — Ambulatory Visit: Payer: Medicare PPO | Admitting: Dermatology

## 2021-08-08 ENCOUNTER — Other Ambulatory Visit: Payer: Self-pay | Admitting: Family Medicine

## 2021-08-08 DIAGNOSIS — Z1231 Encounter for screening mammogram for malignant neoplasm of breast: Secondary | ICD-10-CM

## 2021-08-11 ENCOUNTER — Other Ambulatory Visit: Payer: Self-pay

## 2021-08-11 ENCOUNTER — Ambulatory Visit: Payer: Medicare PPO | Attending: Orthopedic Surgery | Admitting: Occupational Therapy

## 2021-08-11 ENCOUNTER — Encounter: Payer: Self-pay | Admitting: Occupational Therapy

## 2021-08-11 DIAGNOSIS — M25641 Stiffness of right hand, not elsewhere classified: Secondary | ICD-10-CM | POA: Diagnosis present

## 2021-08-11 DIAGNOSIS — M25631 Stiffness of right wrist, not elsewhere classified: Secondary | ICD-10-CM | POA: Diagnosis present

## 2021-08-11 DIAGNOSIS — M79644 Pain in right finger(s): Secondary | ICD-10-CM | POA: Diagnosis not present

## 2021-08-11 DIAGNOSIS — M6281 Muscle weakness (generalized): Secondary | ICD-10-CM

## 2021-08-11 DIAGNOSIS — M79631 Pain in right forearm: Secondary | ICD-10-CM | POA: Insufficient documentation

## 2021-08-11 NOTE — Therapy (Signed)
[facility] [address], [place], [address] Phone: [phone]   Fax:  [phone]  Occupational Therapy Evaluation  Patient Details  Name: Whitney Liu MRN: [id number] Date of Birth: [birth year] Referring Provider (OT): DR Rudene Christians   Encounter Date: 08/11/2021   OT End of Session - 08/11/21 1934     Visit Number 1    Number of Visits 6    Date for OT Re-Evaluation 09/22/21    OT Start Time 0830    OT Stop Time 0914    OT Time Calculation (min) 44 min    Activity Tolerance Patient tolerated treatment well    Behavior During Therapy [facility] for tasks assessed/performed             Past Medical History:  Diagnosis Date   Arthritis    Basal cell carcinoma 06/17/2013   Left nasal canthus.  Mohs   Basal cell carcinoma 02/08/2020   Right post. thigh Superficial and nodular patterns. EDC.   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 Late lesion with regression.   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 WD SCC, consistent with KA. Excised 07/02/2013.    Past Surgical History:  Procedure Laterality Date   ABDOMINAL HYSTERECTOMY     APPENDECTOMY     AXILLARY LYMPH NODE BIOPSY Right    BREAST BIOPSY Right 2014   neg   CATARACT EXTRACTION W/PHACO Right 07/10/2017   Procedure: CATARACT EXTRACTION PHACO AND INTRAOCULAR LENS PLACEMENT (IOC);  Surgeon: Leandrew Koyanagi, MD;  Location: [place];  Service: Ophthalmology;  Laterality: Right;  IVA TOPICAL RIGHT   CATARACT EXTRACTION W/PHACO Left 06/10/2018   Procedure: CATARACT EXTRACTION PHACO AND INTRAOCULAR LENS PLACEMENT ([place]) LEFT IVA TOPICAL;  Surgeon: Leandrew Koyanagi, MD;  Location: [place];  Service: Ophthalmology;  Laterality: Left;   OOPHORECTOMY      There were no vitals filed for this  visit.   Subjective Assessment - 08/11/21 1925     Subjective  Pain in my middle finger since about end of June - slept wrong on night and felt pop -and pain since then in the midlde finger- no triggering but as the day goes more pain and into my wrist and forearm too - hard time eating, writing , on computer 2 x wk at [employer] and lifting or picking up    Pertinent History Whitney Liu is a 75 y.o. female here today.     The patient presents for evaluation of right long finger pain and difficulty with extension. She had prior x-rays that Dr. Rosalia Hammers had done. She refused an injection when he saw her. X-rays reviewed today from 06/29/2021. Right long finger shows no significant degenerative changes. No acute process.    The patient states her right long finger is bothering her due to her being right-hand dominant. 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 She has been using cocoa butter on her right long finger, but it was shooting pain all the way up at one time. She has had symptoms for more than 2 months.REfer to OT    Patient Stated Goals Want the pain and motion better in my dominant hand in the middle finger so I can use it with grip, writing , computer ,  eating, squeez    Currently in Pain? Yes    Pain Score 8     Pain Location Hand   volar forearm   Pain Orientation Right    Pain Descriptors / Indicators Aching;Tender;Tightness;Shooting    Pain Type Acute pain    Pain Onset More than a month ago    Pain Frequency Intermittent    Aggravating Factors  using of hand or middle finger - into forearm the pain               [facility] OT Assessment - 08/11/21 0001       Assessment   Medical Diagnosis R 3rd trigger finger PIP flexor contracture    Referring Provider (OT) DR Rudene Christians    Onset Date/Surgical Date 06/01/21    Hand Dominance Right      Home  Environment   Lives With Spouse      Prior Function   Vocation Part time employment    Leisure work 2  days on computer at [employer], likes to walk, read, do some house work and cooking      AROM   Right Wrist Extension 65 Degrees   pull   Right Wrist Flexion 95 Degrees    Left Wrist Extension 70 Degrees      Right Hand AROM   R Thumb Opposition to Index --   stiffness opposition to 3rd   R Long  MCP 0-90 80 Degrees   pain 7-8/10   R Long PIP 0-100 85 Degrees   pain 7-8/10  Ext -10                     OT Treatments/Exercises (OP) - 08/11/21 0001       RUE Contrast Bath   Time 8 minutes    Comments R hand prior to HEP review             Review HEP with pt - hand out provided   Contrast 2-3 x day Soft massage over hand , palm and forearm  AROM tendon glides - block - 10 reps  And opposition to all digits- 10 Reps  Pain free  Keep apin under 2/1o   Enlarge grip and use palm or larger joints to pick up or carry objects       OT Education - 08/11/21 1933     Education Details findings of eval and HEP    Person(s) Educated Patient    Methods Explanation;Demonstration;Tactile cues;Verbal cues;Handout    Comprehension Verbal cues required;Returned demonstration;Verbalized understanding                 OT Long Term Goals - 08/11/21 1939       OT LONG TERM GOAL #1   Title Pt to be independent in HEP to decrease pain and edema , increase AROM in 3rd digit to touch palm without increase symptoms    Baseline MC 80, PIP 85 pain increase 3-8/10 - cannot touch palm - no knowledge of HEP    Time 3    Period Weeks    Status New    Target Date 09/01/21      OT LONG TERM GOAL #2   Title Pt R hand 3rd digit increase to WNL to use in light ADL's and computer with pain less than 2./10 at the worse    Baseline Decrease flexion 80 MC , PIP 85 - PIP  ext -10 - pain 8/10 increase with fisting    Time 4  Period Weeks    Status New    Target Date 09/08/21      OT LONG TERM GOAL #3   Title Pt to have AROM in R hand and wrist WNL without increase symptoms to  return to prior level of function writing , computer , carry groceries    Baseline pain 8/10 increase with fisting - MC 80, PIP 85 -    Time 6    Period Weeks    Status New    Target Date 09/22/21                   Plan - 08/11/21 1934     Clinical Impression Statement Pt refer with diagnosis of R 3rd digit trigger finger and  PIP flexor contracture -pt this date about 2 1/2 month our from injury - pt tender over A1pulley but no triggering or locking report -pain at rest 3/10 but increase to 7-8/10 over the PIP and DIP during flexion and radiating down palm to volar forearm- pt tender and trigger points over flexors in forearm and tenderness over A1pulley - flexion decrease in 3rd and PIP -10 for extention - with increase pain and edema and decrease ROM and strength pt limited in functional use of R dominant hand and can benefit from skilled OT services    OT Occupational Profile and History Problem Focused Assessment - Including review of records relating to presenting problem    Occupational performance deficits (Please refer to evaluation for details): ADL's;IADL's;Work;Play;Leisure    Body Structure / Function / Physical Skills ADL;Decreased knowledge of precautions;Flexibility;IADL;ROM;Edema;UE functional use;Pain;Strength    Rehab Potential Good    Clinical Decision Making Limited treatment options, no task modification necessary    Comorbidities Affecting Occupational Performance: None    Modification or Assistance to Complete Evaluation  No modification of tasks or assist necessary to complete eval    OT Frequency 1x / week    OT Duration 6 weeks    OT Treatment/Interventions Self-care/ADL training;Cryotherapy;Iontophoresis;Contrast Bath;Fluidtherapy;DME and/or AE instruction;Splinting;Therapeutic exercise;Manual Therapy;Passive range of motion    Consulted and Agree with Plan of Care Patient             Patient will benefit from skilled therapeutic intervention in  order to improve the following deficits and impairments:   Body Structure / Function / Physical Skills: ADL, Decreased knowledge of precautions, Flexibility, IADL, ROM, Edema, UE functional use, Pain, Strength       Visit Diagnosis: Pain in right finger(s) - Plan: Ot plan of care cert/re-cert  Pain in right forearm - Plan: Ot plan of care cert/re-cert  Stiffness of right hand, not elsewhere classified - Plan: Ot plan of care cert/re-cert  Stiffness of right wrist, not elsewhere classified - Plan: Ot plan of care cert/re-cert  Muscle weakness (generalized) - Plan: Ot plan of care cert/re-cert    Problem List Patient Active Problem List   Diagnosis Date Noted   Diverticulitis 07/02/2017    Rosalyn Gess, OTR/L, CLT 08/11/2021, 7:45 PM  [facility] [address] [place], [address] Phone: [phone]   Fax:  [phone]  Name: Whitney Liu MRN: [id number] Date of Birth: 1946/08/21

## 2021-08-17 ENCOUNTER — Ambulatory Visit: Payer: Medicare PPO | Admitting: Occupational Therapy

## 2021-08-17 DIAGNOSIS — M25631 Stiffness of right wrist, not elsewhere classified: Secondary | ICD-10-CM

## 2021-08-17 DIAGNOSIS — M79631 Pain in right forearm: Secondary | ICD-10-CM

## 2021-08-17 DIAGNOSIS — M25641 Stiffness of right hand, not elsewhere classified: Secondary | ICD-10-CM

## 2021-08-17 DIAGNOSIS — M79644 Pain in right finger(s): Secondary | ICD-10-CM

## 2021-08-17 DIAGNOSIS — M6281 Muscle weakness (generalized): Secondary | ICD-10-CM

## 2021-08-17 NOTE — Therapy (Signed)
[facility] [address], [place], [address] Phone: [phone]   Fax:  [phone]  Occupational Therapy Treatment  Patient Details  Name: Whitney Liu MRN: [id number] Date of Birth: [birth year] Referring Provider (OT): DR Whitney Liu   Encounter Date: 08/17/2021   OT End of Session - 08/17/21 1540     Visit Number 2    Number of Visits 6    Date for OT Re-Evaluation 09/22/21    OT Start Time 1400    OT Stop Time 1501    OT Time Calculation (min) 61 min    Activity Tolerance Patient tolerated treatment well    Behavior During Therapy [facility] for tasks assessed/performed             Past Medical History:  Diagnosis Date   Arthritis    Basal cell carcinoma 06/17/2013   Left nasal canthus.  Mohs   Basal cell carcinoma 02/08/2020   Right post. thigh Superficial and nodular patterns. EDC.   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 Late lesion with regression.   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 WD SCC, consistent with KA. Excised 07/02/2013.    Past Surgical History:  Procedure Laterality Date   ABDOMINAL HYSTERECTOMY     APPENDECTOMY     AXILLARY LYMPH NODE BIOPSY Right    BREAST BIOPSY Right 2014   neg   CATARACT EXTRACTION W/PHACO Right 07/10/2017   Procedure: CATARACT EXTRACTION PHACO AND INTRAOCULAR LENS PLACEMENT (IOC);  Surgeon: Whitney Koyanagi, MD;  Location: [place];  Service: Ophthalmology;  Laterality: Right;  IVA TOPICAL RIGHT   CATARACT EXTRACTION W/PHACO Left 06/10/2018   Procedure: CATARACT EXTRACTION PHACO AND INTRAOCULAR LENS PLACEMENT ([place]) LEFT IVA TOPICAL;  Surgeon: Whitney Koyanagi, MD;  Location: [place];  Service: Ophthalmology;  Laterality: Left;   OOPHORECTOMY      There were no vitals filed for this  visit.   Subjective Assessment - 08/17/21 1429     Subjective  Doing okay -still pain about the same - done the exercises - could do all except prayer stretch    Pertinent History Whitney Liu is a 75 y.o. female here today.     The patient presents for evaluation of right long finger pain and difficulty with extension. She had prior x-rays that Dr. Rosalia Hammers had done. She refused an injection when he saw her. X-rays reviewed today from 06/29/2021. Right long finger shows no significant degenerative changes. No acute process.    The patient states her right long finger is bothering her due to her being right-hand dominant. 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 She has been using cocoa butter on her right long finger, but it was shooting pain all the way up at one time. She has had symptoms for more than 2 months.REfer to OT    Patient Stated Goals Want the pain and motion better in my dominant hand in the middle finger so I can use it with grip, writing , computer , eating, squeez    Currently in Pain? Yes    Pain Score 8     Pain Location Hand    Pain Orientation Right    Pain Descriptors / Indicators Aching;Tightness;Tender    Pain Frequency Intermittent  [facility] OT Assessment - 08/17/21 0001       Right Hand AROM   R Long  MCP 0-90 80 Degrees   -20   R Long PIP 0-100 100 Degrees              Pt return with increase AROM -but edema in PIP of 3rd , pain still increase to 8/10 with intrinsic and composite fist  Wrist and digits extention increase pain in hand and forearm        OT Treatments/Exercises (OP) - 08/17/21 0001       RUE Contrast Bath   Time 8 minutes    Comments R hand prior to ROM            Pt to cont with  Contrast 2-3 x day Soft massage over hand , palm and forearm done by OT - and rolling over foam roller - add to HEP for slight extention  MC flexion with PIP extention - light PROM - pain less  than 2-3/10   AROM tendon glides - block - 10 reps but composite fist to foam roller - pain under 2-3/10   And opposition to all digits- 10 Reps  Pain free  Keep pain under 2/10   Enlarge grip and use palm or larger joints to pick up or carry objects Ionto done with dexamethazone for med patch over A1pulley of 3rd digit - pt ed on use - skin check done prior and afterwards   Tolerated well - current 2.0 and 19 min           OT Education - 08/17/21 1540     Education Details progress and HEP    Person(s) Educated Patient    Methods Explanation;Demonstration;Tactile cues;Verbal cues;Handout    Comprehension Verbal cues required;Returned demonstration;Verbalized understanding                 OT Long Term Goals - 08/11/21 1939       OT LONG TERM GOAL #1   Title Pt to be independent in HEP to decrease pain and edema , increase AROM in 3rd digit to touch palm without increase symptoms    Baseline MC 80, PIP 85 pain increase 3-8/10 - cannot touch palm - no knowledge of HEP    Time 3    Period Weeks    Status New    Target Date 09/01/21      OT LONG TERM GOAL #2   Title Pt R hand 3rd digit increase to WNL to use in light ADL's and computer with pain less than 2./10 at the worse    Baseline Decrease flexion 80 MC , PIP 85 - PIP  ext -10 - pain 8/10 increase with fisting    Time 4    Period Weeks    Status New    Target Date 09/08/21      OT LONG TERM GOAL #3   Title Pt to have AROM in R hand and wrist WNL without increase symptoms to return to prior level of function writing , computer , carry groceries    Baseline pain 8/10 increase with fisting - MC 80, PIP 85 -    Time 6    Period Weeks    Status New    Target Date 09/22/21                   Plan - 08/17/21 1542     Clinical Impression Statement Pt refer with diagnosis of R 3rd  digit trigger finger and  PIP flexor contracture -pt is about 2 1/2 month our from injury - pt tender over A1pulley but no  triggering or locking report -pain at rest 3/10 but increase to 7-8/10 over the PIP and DIP during flexion and radiating down palm to volar forearm- pt tender and trigger points over flexors in forearm and tenderness over A1pulley - flexion improved since eval to [facility] 80 and PIP 100 - but pain still increase 8/10 - PIP extention coming in -20  - Pt show increase AROM but increase pain - pt to maintain progress this next few days - but keep pain under 3-5/10 - with increase pain and edema and decrease ROM and strength pt limited in functional use of R dominant hand and can benefit from skilled OT services    OT Occupational Profile and History Problem Focused Assessment - Including review of records relating to presenting problem    Occupational performance deficits (Please refer to evaluation for details): ADL's;IADL's;Work;Play;Leisure    Body Structure / Function / Physical Skills ADL;Decreased knowledge of precautions;Flexibility;IADL;ROM;Edema;UE functional use;Pain;Strength    Rehab Potential Good    Clinical Decision Making Limited treatment options, no task modification necessary    Comorbidities Affecting Occupational Performance: None    Modification or Assistance to Complete Evaluation  No modification of tasks or assist necessary to complete eval    OT Frequency 1x / week    OT Duration 6 weeks    OT Treatment/Interventions Self-care/ADL training;Cryotherapy;Iontophoresis;Contrast Bath;Fluidtherapy;DME and/or AE instruction;Splinting;Therapeutic exercise;Manual Therapy;Passive range of motion    Consulted and Agree with Plan of Care Patient             Patient will benefit from skilled therapeutic intervention in order to improve the following deficits and impairments:   Body Structure / Function / Physical Skills: ADL, Decreased knowledge of precautions, Flexibility, IADL, ROM, Edema, UE functional use, Pain, Strength       Visit Diagnosis: Pain in right finger(s)  Pain in right  forearm  Stiffness of right wrist, not elsewhere classified  Muscle weakness (generalized)  Stiffness of right hand, not elsewhere classified    Problem List Patient Active Problem List   Diagnosis Date Noted   Diverticulitis 07/02/2017    Rosalyn Gess, OTR/L,CLT 08/17/2021, 3:46 PM  [facility] [address] [place], [address] Phone: [phone]   Fax:  [phone]  Name: Whitney Liu MRN: [id number] Date of Birth: 1946/07/15

## 2021-08-21 ENCOUNTER — Ambulatory Visit: Payer: Medicare PPO | Admitting: Occupational Therapy

## 2021-08-21 ENCOUNTER — Ambulatory Visit: Payer: Medicare PPO | Admitting: Dermatology

## 2021-08-21 ENCOUNTER — Other Ambulatory Visit: Payer: Self-pay

## 2021-08-21 DIAGNOSIS — D229 Melanocytic nevi, unspecified: Secondary | ICD-10-CM

## 2021-08-21 DIAGNOSIS — L814 Other melanin hyperpigmentation: Secondary | ICD-10-CM

## 2021-08-21 DIAGNOSIS — M79644 Pain in right finger(s): Secondary | ICD-10-CM

## 2021-08-21 DIAGNOSIS — M25641 Stiffness of right hand, not elsewhere classified: Secondary | ICD-10-CM

## 2021-08-21 DIAGNOSIS — D1800 Hemangioma unspecified site: Secondary | ICD-10-CM

## 2021-08-21 DIAGNOSIS — D692 Other nonthrombocytopenic purpura: Secondary | ICD-10-CM

## 2021-08-21 DIAGNOSIS — Z85828 Personal history of other malignant neoplasm of skin: Secondary | ICD-10-CM

## 2021-08-21 DIAGNOSIS — D225 Melanocytic nevi of trunk: Secondary | ICD-10-CM

## 2021-08-21 DIAGNOSIS — L300 Nummular dermatitis: Secondary | ICD-10-CM

## 2021-08-21 DIAGNOSIS — M79631 Pain in right forearm: Secondary | ICD-10-CM

## 2021-08-21 DIAGNOSIS — L570 Actinic keratosis: Secondary | ICD-10-CM | POA: Diagnosis not present

## 2021-08-21 DIAGNOSIS — M6281 Muscle weakness (generalized): Secondary | ICD-10-CM

## 2021-08-21 DIAGNOSIS — L578 Other skin changes due to chronic exposure to nonionizing radiation: Secondary | ICD-10-CM

## 2021-08-21 DIAGNOSIS — Z1283 Encounter for screening for malignant neoplasm of skin: Secondary | ICD-10-CM | POA: Diagnosis not present

## 2021-08-21 DIAGNOSIS — L821 Other seborrheic keratosis: Secondary | ICD-10-CM

## 2021-08-21 DIAGNOSIS — L820 Inflamed seborrheic keratosis: Secondary | ICD-10-CM | POA: Diagnosis not present

## 2021-08-21 DIAGNOSIS — M25631 Stiffness of right wrist, not elsewhere classified: Secondary | ICD-10-CM

## 2021-08-21 MED ORDER — TRIAMCINOLONE ACETONIDE 0.1 % EX CREA: TOPICAL_CREAM | CUTANEOUS | 2 refills | Status: AC

## 2021-08-21 NOTE — Progress Notes (Signed)
Follow-Up Visit   Subjective  Whitney Liu is a 75 y.o. female who presents for the following: Annual Exam (Patient presents for TBSE. She has a history of SCCs and BCCs. She has a few rough bumps on her legs that she notices when bathin, itchy at times. She also has an itchy back. ).   The following portions of the chart were reviewed this encounter and updated as appropriate:       Review of Systems:  No other skin or systemic complaints except as noted in HPI or Assessment and Plan.  Objective  Well appearing patient in no apparent distress; mood and affect are within normal limits.  A full examination was performed including scalp, head, eyes, ears, nose, lips, neck, chest, axillae, abdomen, back, buttocks, bilateral upper extremities, bilateral lower extremities, hands, feet, fingers, toes, fingernails, and toenails. All findings within normal limits unless otherwise noted below.  R lat thigh x 3, L elbow x 1, L thigh x 3 (7) Erythematous keratotic or waxy stuck-on papule   Left Upper Back Pink scaly patch.  Left Ant Flank 7.0 x 4.55m brown speckled macule    Left Lower Back 5.0 x 3.022mmedium dark brown macule  L upper chest/inf clavicle x 4, R lower sternum x 1 (5) Erythematous thin papules/macules with gritty scale.    Assessment & Plan  Skin cancer screening performed today.  Actinic Damage - chronic, secondary to cumulative UV radiation exposure/sun exposure over time - diffuse scaly erythematous macules with underlying dyspigmentation - Recommend daily broad spectrum sunscreen SPF 30+ to sun-exposed areas, reapply every 2 hours as needed.  - Recommend staying in the shade or wearing long sleeves, sun glasses (UVA+UVB protection) and wide brim hats (4-inch brim around the entire circumference of the hat). - Call for new or changing lesions.  History of Basal Cell Carcinoma of the Skin - No evidence of recurrence today - Recommend regular full body skin  exams - Recommend daily broad spectrum sunscreen SPF 30+ to sun-exposed areas, reapply every 2 hours as needed.  - Call if any new or changing lesions are noted between office visits  History of Squamous Cell Carcinoma of the Skin - No evidence of recurrence today - Recommend regular full body skin exams - Recommend daily broad spectrum sunscreen SPF 30+ to sun-exposed areas, reapply every 2 hours as needed.  - Call if any new or changing lesions are noted between office visits  Lentigines - Scattered tan macules - Due to sun exposure - Benign-appering, observe - Recommend daily broad spectrum sunscreen SPF 30+ to sun-exposed areas, reapply every 2 hours as needed. - Call for any changes  Seborrheic Keratoses - Stuck-on, waxy, tan-brown papules and/or plaques  - Benign-appearing - Discussed benign etiology and prognosis. - Observe - Call for any changes  Hemangiomas - Red papules - Discussed benign nature - Observe - Call for any changes  Purpura - Chronic; persistent and recurrent.  Treatable, but not curable. - Violaceous macules and patches - Benign - Related to trauma, age, sun damage and/or use of blood thinners, chronic use of topical and/or oral steroids - Observe - Can use OTC arnica containing moisturizer such as Dermend Bruise Formula if desired - Call for worsening or other concerns  Melanocytic Nevi - Tan-brown and/or pink-flesh-colored symmetric macules and papules, including flesh papule and right flank at bra line - Benign appearing on exam today - Observation - Call clinic for new or changing moles - Recommend daily use  of broad spectrum spf 30+ sunscreen to sun-exposed areas.   Inflamed seborrheic keratosis R lat thigh x 3, L elbow x 1, L thigh x 3  Destruction of lesion - R lat thigh x 3, L elbow x 1, L thigh x 3  Destruction method: cryotherapy   Informed consent: discussed and consent obtained   Lesion destroyed using liquid nitrogen: Yes    Region frozen until ice ball extended beyond lesion: Yes   Outcome: patient tolerated procedure well with no complications   Post-procedure details: wound care instructions given   Additional details:  Prior to procedure, discussed risks of blister formation, small wound, skin dyspigmentation, or rare scar following cryotherapy. Recommend Vaseline ointment to treated areas while healing.   Nummular dermatitis Left Upper Back  Recommend mild soap and moisturizing cream 1-2 times daily.  Gentle skin care handout provided.   Start TMC 0.1% Cream Apply to affected areas rash 1-2 times a day until improved. Avoid face, groin, axilla.    triamcinolone cream (KENALOG) 0.1 % - Left Upper Back Apply to affected areas itchy rash 1-2 times a day until improved. Avoid face, groin, axilla.  Nevus (2) Left Ant Flank; Left Lower Back  Benign-appearing.  Stable. Observation.  Call clinic for new or changing moles.  Recommend daily use of broad spectrum spf 30+ sunscreen to sun-exposed areas.   AK (actinic keratosis) (5) L upper chest/inf clavicle x 4, R lower sternum x 1  Actinic keratoses are precancerous spots that appear secondary to cumulative UV radiation exposure/sun exposure over time. They are chronic with expected duration over 1 year. A portion of actinic keratoses will progress to squamous cell carcinoma of the skin. It is not possible to reliably predict which spots will progress to skin cancer and so treatment is recommended to prevent development of skin cancer.  Recommend daily broad spectrum sunscreen SPF 30+ to sun-exposed areas, reapply every 2 hours as needed.  Recommend staying in the shade or wearing long sleeves, sun glasses (UVA+UVB protection) and wide brim hats (4-inch brim around the entire circumference of the hat). Call for new or changing lesions.  Destruction of lesion - L upper chest/inf clavicle x 4, R lower sternum x 1  Destruction method: cryotherapy   Informed  consent: discussed and consent obtained   Lesion destroyed using liquid nitrogen: Yes   Region frozen until ice ball extended beyond lesion: Yes   Outcome: patient tolerated procedure well with no complications   Post-procedure details: wound care instructions given   Additional details:  Prior to procedure, discussed risks of blister formation, small wound, skin dyspigmentation, or rare scar following cryotherapy. Recommend Vaseline ointment to treated areas while healing.   Return in about 1 year (around 08/21/2022) for TBSE.  IJamesetta Orleans, CMA, am acting as scribe for Brendolyn Patty, MD . Documentation: I have reviewed the above documentation for accuracy and completeness, and I agree with the above.  Brendolyn Patty MD

## 2021-08-21 NOTE — Therapy (Signed)
[facility] [address] [place], [address] Phone: [phone]   Fax:  [phone]  Occupational Therapy Treatment  Patient Details  Name: Whitney Liu MRN: [id number] Date of Birth: 1945-12-13 Referring Provider (OT): DR Rudene Christians   Encounter Date: 08/21/2021   OT End of Session - 08/21/21 0734     Visit Number 3    Number of Visits 6    Date for OT Re-Evaluation 09/22/21    OT Start Time 0734    OT Stop Time 0820    OT Time Calculation (min) 46 min    Activity Tolerance Patient tolerated treatment well    Behavior During Therapy [facility] for tasks assessed/performed             Past Medical History:  Diagnosis Date   Arthritis    Basal cell carcinoma 06/17/2013   Left nasal canthus.  Mohs   Basal cell carcinoma 02/08/2020   Right post. thigh Superficial and nodular patterns. EDC.   Cancer (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 Late lesion with regression.   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 WD SCC, consistent with KA. Excised 07/02/2013.    Past Surgical History:  Procedure Laterality Date   ABDOMINAL HYSTERECTOMY     APPENDECTOMY     AXILLARY LYMPH NODE BIOPSY Right    BREAST BIOPSY Right 2014   neg   CATARACT EXTRACTION W/PHACO Right 07/10/2017   Procedure: CATARACT EXTRACTION PHACO AND INTRAOCULAR LENS PLACEMENT (IOC);  Surgeon: Leandrew Koyanagi, MD;  Location: [place];  Service: Ophthalmology;  Laterality: Right;  IVA TOPICAL RIGHT   CATARACT EXTRACTION W/PHACO Left 06/10/2018   Procedure: CATARACT EXTRACTION PHACO AND INTRAOCULAR LENS PLACEMENT ([place]) LEFT IVA TOPICAL;  Surgeon: Leandrew Koyanagi, MD;  Location: [place];  Service: Ophthalmology;  Laterality: Left;   OOPHORECTOMY      There were no vitals filed for this  visit.   Subjective Assessment - 08/21/21 0734     Subjective  Doing better - pain not as high anymore- felt good Friday and Sat    Pertinent History Whitney Liu is a 75 y.o. female here today.     The patient presents for evaluation of right long finger pain and difficulty with extension. She had prior x-rays that Dr. Rosalia Hammers had done. She refused an injection when he saw her. X-rays reviewed today from 06/29/2021. Right long finger shows no significant degenerative changes. No acute process.    The patient states her right long finger is bothering her due to her being right-hand dominant. 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 She has been using cocoa butter on her right long finger, but it was shooting pain all the way up at one time. She has had symptoms for more than 2 months.REfer to OT    Patient Stated Goals Want the pain and motion better in my dominant hand in the middle finger so I can use it with grip, writing , computer , eating, squeez    Currently in Pain? Yes    Pain Score 5     Pain Location Hand    Pain Orientation Right    Pain Descriptors / Indicators Aching;Tightness;Tender    Pain Onset More than a month ago  Pain Frequency Intermittent                [facility] OT Assessment - 08/21/21 0001       AROM   Right Wrist Extension 65 Degrees   less pain     Right Hand AROM   R Long  MCP 0-90 85 Degrees    R Long PIP 0-100 100 Degrees   pain 5/10            Pt show increase PIP extention -and flexion increase to eval and with less pain than last time  Wrist AROM increase with less pain  Able to tolerate soft tissue more          OT Treatments/Exercises (OP) - 08/21/21 0001       RUE Fluidotherapy   Number Minutes Fluidotherapy 11 Minutes    RUE Fluidotherapy Location Hand;Wrist    Comments AROM with 2 x 1 min ice inbetween           Pt to cont with  Contrast 2-3 x day Soft massage over hand , palm and  forearm done by OT - LIght graston tool nr 2 done sweeping over volar wrist and forearm this date  And can cont to do at home rolling over foam roller - add to HEP for slight extention  last time  MC flexion  at 90 with PIP extention - light PROM - pain less than 2-3/10    AROM tendon glides - block - 10 reps but composite fist to foam roller - pain under 2-3/10   And opposition to all digits- 10 Reps  Pain free  Keep pain under 2/10   Enlarge grip and use palm or larger joints to pick up or carry objects Ionto done with dexamethazone for med patch over A1pulley of 3rd digit - pt ed on use - skin check done prior and afterwards   Tolerated well - current 2.0 and 19 min          OT Education - 08/21/21 0734     Education Details progress and HEP    Person(s) Educated Patient    Methods Explanation;Demonstration;Tactile cues;Verbal cues;Handout    Comprehension Verbal cues required;Returned demonstration;Verbalized understanding                 OT Long Term Goals - 08/11/21 1939       OT LONG TERM GOAL #1   Title Pt to be independent in HEP to decrease pain and edema , increase AROM in 3rd digit to touch palm without increase symptoms    Baseline MC 80, PIP 85 pain increase 3-8/10 - cannot touch palm - no knowledge of HEP    Time 3    Period Weeks    Status New    Target Date 09/01/21      OT LONG TERM GOAL #2   Title Pt R hand 3rd digit increase to WNL to use in light ADL's and computer with pain less than 2./10 at the worse    Baseline Decrease flexion 15 MC , PIP 85 - PIP  ext -10 - pain 8/10 increase with fisting    Time 4    Period Weeks    Status New    Target Date 09/08/21      OT LONG TERM GOAL #3   Title Pt to have AROM in R hand and wrist WNL without increase symptoms to return to prior level of function writing , computer , carry groceries  Baseline pain 8/10 increase with fisting - MC 80, PIP 85 -    Time 6    Period Weeks    Status New    Target  Date 09/22/21                   Plan - 08/21/21 0735     Clinical Impression Statement Pt refer with diagnosis of R 3rd digit trigger finger and  PIP flexor contracture -pt is about 2 -3 months our from injury - pt tenderness over A1pulley decrease from 7-8/10 to 3-4/10 this date and no triggering or locking report. Pt with increase wrist AROM and and 3rd digit extnetion and flexion increase with less pain than last visit -cont to have t tenderness and trigger points over flexors in forearm and tenderness over A1pulley -Pt to cont to keep pain under 2-3/10 - with use and AROM - and decrease pain, edema and increase AROM painfree - with increase pain and edema and decrease ROM and strength pt limited in functional use of R dominant hand and can benefit from skilled OT services    OT Occupational Profile and History Problem Focused Assessment - Including review of records relating to presenting problem    Occupational performance deficits (Please refer to evaluation for details): ADL's;IADL's;Work;Play;Leisure    Body Structure / Function / Physical Skills ADL;Decreased knowledge of precautions;Flexibility;IADL;ROM;Edema;UE functional use;Pain;Strength    Rehab Potential Good    Clinical Decision Making Limited treatment options, no task modification necessary    Comorbidities Affecting Occupational Performance: None    Modification or Assistance to Complete Evaluation  No modification of tasks or assist necessary to complete eval    OT Frequency 1x / week    OT Duration 6 weeks    OT Treatment/Interventions Self-care/ADL training;Cryotherapy;Iontophoresis;Contrast Bath;Fluidtherapy;DME and/or AE instruction;Splinting;Therapeutic exercise;Manual Therapy;Passive range of motion    Consulted and Agree with Plan of Care Patient             Patient will benefit from skilled therapeutic intervention in order to improve the following deficits and impairments:   Body Structure / Function /  Physical Skills: ADL, Decreased knowledge of precautions, Flexibility, IADL, ROM, Edema, UE functional use, Pain, Strength       Visit Diagnosis: Pain in right finger(s)  Pain in right forearm  Stiffness of right wrist, not elsewhere classified  Muscle weakness (generalized)  Stiffness of right hand, not elsewhere classified    Problem List Patient Active Problem List   Diagnosis Date Noted   Diverticulitis 07/02/2017    Rosalyn Gess, OT/L 08/21/2021, 9:14 PM  [facility] [address] [place], [address] Phone: [phone]   Fax:  [phone]  Name: Whitney Liu MRN: [id number] Date of Birth: 1946/01/17

## 2021-08-21 NOTE — Patient Instructions (Addendum)
Cryotherapy Aftercare  Wash gently with soap and water everyday.   Apply Vaseline and Band-Aid daily until healed.    Topical steroids (such as triamcinolone, fluocinolone, fluocinonide, mometasone, clobetasol, halobetasol, betamethasone, hydrocortisone) can cause thinning and lightening of the skin if they are used for too long in the same area. Your physician has selected the right strength medicine for your problem and area affected on the body. Please use your medication only as directed by your physician to prevent side effects.    Dry Skin Care  What causes dry skin?  Dry skin is common and results from inadequate moisture in the outer skin layers. Dry skin usually results from the excessive loss of moisture from the skin surface. This occurs due to two major factors: Normally the skin's oil glands deposit a layer of oil on the skin's surface. This layer of oil prevents the loss of moisture from the skin. Exposure to soaps, cleaners, solvents, and disinfectants removes this oily film, allowing water to escape. Water loss from the skin increases when the humidity is low. During winter months we spend a lot of time indoors where the air is heated. Heated air has very low humidity. This also contributes to dry skin.  A tendency for dry skin may accompany such disorders as eczema. Also, as people age, the number of functioning oil glands decreases, and the tendency toward dry skin can be a sensation of skin tightness when emerging from the shower.  How do I manage dry skin?  Humidify your environment. This can be accomplished by using a humidifier in your bedroom at night during winter months. Bathing can actually put moisture back into your skin if done right. Take the following steps while bathing to sooth dry skin: Avoid hot water, which only dries the skin and makes itching worse. Use warm water. Avoid washcloths or extensive rubbing or scrubbing. Use mild soaps like unscented Dove,  Oil of Olay, Cetaphil, Basis, or CeraVe. If you take baths rather than showers, rinse off soap residue with clean water before getting out of tub. Once out of the shower/tub, pat dry gently with a soft towel. Leave your skin damp. While still damp, apply any medicated ointment/cream you were prescribed to the affected areas. After you apply your medicated ointment/cream, then apply your moisturizer to your whole body.This is the most important step in dry skin care. If this is omitted, your skin will continue to be dry. The choice of moisturizer is also very important. In general, lotion will not provider enough moisture to severely dry skin because it is water based. You should use an ointment or cream. Moisturizers should also be unscented. Good choices include Vaseline (plain petrolatum), Aquaphor, Cetaphil, CeraVe, Vanicream, DML Forte, Aveeno moisture, or Eucerin Cream. Bath oils can be helpful, but do not replace the application of moisturizer after the bath. In addition, they make the tub slippery causing an increased risk for falls. Therefore, we do not recommend their use.Cryotherapy Aftercare  Wash gently with soap and water everyday.   Apply Vaseline and Band-Aid daily until healed.   If you have any questions or concerns for your doctor, please call our main line at [phone] and press option 4 to reach your doctor's medical assistant. If no one answers, please leave a voicemail as directed and we will return your call as soon as possible. Messages left after 4 pm will be answered the following business day.   You may also send [place] a message via [facility]. We  typically respond to MyChart messages within 1-2 business days.  For prescription refills, please ask your pharmacy to contact our office. Our fax number is [phone].  If you have an urgent issue when the clinic is closed that cannot wait until the next business day, you can page your doctor at the number below.    Please note  that while we do our best to be available for urgent issues outside of office hours, we are not available 24/7.   If you have an urgent issue and are unable to reach [place], you may choose to seek medical care at your doctor's office, retail clinic, urgent care center, or emergency room.  If you have a medical emergency, please immediately call 911 or go to the emergency department.  Pager Numbers  - Dr. Nehemiah Massed: [phone]  - Dr. Laurence Ferrari: [phone]  - Dr. Nicole Kindred: [phone]  In the event of inclement weather, please call our main line at [phone] for an update on the status of any delays or closures.  Dermatology Medication Tips: Please keep the boxes that topical medications come in in order to help keep track of the instructions about where and how to use these. Pharmacies typically print the medication instructions only on the boxes and not directly on the medication tubes.   If your medication is too expensive, please contact our office at [phone] option 4 or send [place] a message through [facility].   We are unable to tell what your co-pay for medications will be in advance as this is different depending on your insurance coverage. However, we may be able to find a substitute medication at lower cost or fill out paperwork to get insurance to cover a needed medication.   If a prior authorization is required to get your medication covered by your insurance company, please allow [place] 1-2 business days to complete this process.  Drug prices often vary depending on where the prescription is filled and some pharmacies may offer cheaper prices.  The website www.goodrx.com contains coupons for medications through different pharmacies. The prices here do not account for what the cost may be with help from insurance (it may be cheaper with your insurance), but the website can give you the price if you did not use any insurance.  - You can print the associated coupon and take it with your  prescription to the pharmacy.  - You may also stop by our office during regular business hours and pick up a GoodRx coupon card.  - If you need your prescription sent electronically to a different pharmacy, notify our office through [facility] or by phone at [phone] option 4.

## 2021-08-23 ENCOUNTER — Ambulatory Visit: Payer: Medicare PPO | Admitting: Occupational Therapy

## 2021-08-23 ENCOUNTER — Encounter: Payer: Medicare PPO | Admitting: Occupational Therapy

## 2021-08-24 ENCOUNTER — Encounter: Payer: Medicare PPO | Admitting: Occupational Therapy

## 2021-08-28 ENCOUNTER — Ambulatory Visit: Payer: Medicare PPO | Admitting: Occupational Therapy

## 2021-08-28 DIAGNOSIS — M79644 Pain in right finger(s): Secondary | ICD-10-CM

## 2021-08-28 DIAGNOSIS — M6281 Muscle weakness (generalized): Secondary | ICD-10-CM

## 2021-08-28 DIAGNOSIS — M79631 Pain in right forearm: Secondary | ICD-10-CM

## 2021-08-28 DIAGNOSIS — M25641 Stiffness of right hand, not elsewhere classified: Secondary | ICD-10-CM

## 2021-08-28 DIAGNOSIS — M25631 Stiffness of right wrist, not elsewhere classified: Secondary | ICD-10-CM

## 2021-08-28 NOTE — Therapy (Signed)
[facility] [facility] [address] [place], [address] Phone: [phone]   Fax:  [phone]  Occupational Therapy Treatment  Patient Details  Name: Whitney Liu MRN: [id number] Date of Birth: [birth year] Referring Provider (OT): DR Rudene Christians   Encounter Date: 08/28/2021   OT End of Session - 08/28/21 8937     Visit Number 4    Number of Visits 6    Date for OT Re-Evaluation 09/22/21    OT Start Time 1304    OT Stop Time 1347    OT Time Calculation (min) 43 min    Activity Tolerance Patient tolerated treatment well    Behavior During Therapy [facility] for tasks assessed/performed             Past Medical History:  Diagnosis Date   Arthritis    Basal cell carcinoma 06/17/2013   Left nasal canthus.  Mohs   Basal cell carcinoma 02/08/2020   Right post. thigh Superficial and nodular patterns. EDC.   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 Late lesion with regression.   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 WD SCC, consistent with KA. Excised 07/02/2013.    Past Surgical History:  Procedure Laterality Date   ABDOMINAL HYSTERECTOMY     APPENDECTOMY     AXILLARY LYMPH NODE BIOPSY Right    BREAST BIOPSY Right 2014   neg   CATARACT EXTRACTION W/PHACO Right 07/10/2017   Procedure: CATARACT EXTRACTION PHACO AND INTRAOCULAR LENS PLACEMENT (IOC);  Surgeon: Leandrew Koyanagi, MD;  Location: [facility];  Service: Ophthalmology;  Laterality: Right;  IVA TOPICAL RIGHT   CATARACT EXTRACTION W/PHACO Left 06/10/2018   Procedure: CATARACT EXTRACTION PHACO AND INTRAOCULAR LENS PLACEMENT ([place]) LEFT IVA TOPICAL;  Surgeon: Leandrew Koyanagi, MD;  Location: [facility];  Service: Ophthalmology;  Laterality: Left;   OOPHORECTOMY      There were no vitals filed for this  visit.   Subjective Assessment - 08/28/21 1313     Subjective  I can tell difference - more extention and tenderness is better-still pain like last time but don't feel as tight and sore    Pertinent History Whitney Liu is a 75 y.o. female here today.     The patient presents for evaluation of right long finger pain and difficulty with extension. She had prior x-rays that Dr. Rosalia Hammers had done. She refused an injection when he saw her. X-rays reviewed today from 06/29/2021. Right long finger shows no significant degenerative changes. No acute process.    The patient states her right long finger is bothering her due to her being right-hand dominant. 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 She has been using cocoa butter on her right long finger, but it was shooting pain all the way up at one time. She has had symptoms for more than 2 months.REfer to OT    Patient Stated Goals Want the pain and motion better in my dominant hand in the middle finger so I can use it with grip, writing , computer , eating, squeez    Currently in Pain? Yes    Pain Score 5     Pain Location Hand    Pain Orientation Right    Pain Descriptors / Indicators Aching;Tightness;Tender  Pain Type Acute pain    Pain Onset More than a month ago    Pain Frequency Intermittent             AROM in flexion about same but extention of wrist and digits composite improve , less pain - only in volar DIP and PIP of 3rd  Trigger points on volar forearm and hand improved    Soft massage over hand , palm and forearm done by OT - LIght graston tool nr 2 done sweeping over volar wrist and forearm this date  And can cont to do at home rolling over foam roller - to cont HEP for slight extention  last time  MC flexion  at 90 with PIP extention - light PROM - pain less than 2-3/10  Full extnetion and on table this session    AROM tendon glides - block - 10 reps but composite fist to foam roller -  pain under 2-3/10   And opposition to all digits- 10 Reps  Pain free  Keep pain under 2/10   Enlarge grip and use palm or larger joints to pick up or carry objects          skin check done prior -tolerated well - pt to keep on for hour    OT Treatments/Exercises (OP) - 08/28/21 0001       Iontophoresis   Type of Iontophoresis Dexamethasone    Location 3rd A1pulley on L    Dose 2.0currnt, , med patch    Time 19      RUE Fluidotherapy   Number Minutes Fluidotherapy 8 Minutes    RUE Fluidotherapy Location Hand;Wrist    Comments AROM for hand in all planes                    OT Education - 08/28/21 1554     Education Details progress and HEP    Person(s) Educated Patient    Methods Explanation;Demonstration;Tactile cues;Verbal cues;Handout    Comprehension Verbal cues required;Returned demonstration;Verbalized understanding                 OT Long Term Goals - 08/11/21 1939       OT LONG TERM GOAL #1   Title Pt to be independent in HEP to decrease pain and edema , increase AROM in 3rd digit to touch palm without increase symptoms    Baseline MC 80, PIP 85 pain increase 3-8/10 - cannot touch palm - no knowledge of HEP    Time 3    Period Weeks    Status New    Target Date 09/01/21      OT LONG TERM GOAL #2   Title Pt R hand 3rd digit increase to WNL to use in light ADL's and computer with pain less than 2./10 at the worse    Baseline Decrease flexion 43 MC , PIP 85 - PIP  ext -10 - pain 8/10 increase with fisting    Time 4    Period Weeks    Status New    Target Date 09/08/21      OT LONG TERM GOAL #3   Title Pt to have AROM in R hand and wrist WNL without increase symptoms to return to prior level of function writing , computer , carry groceries    Baseline pain 8/10 increase with fisting - MC 80, PIP 85 -    Time 6    Period Weeks    Status New  Target Date 09/22/21                   Plan - 08/28/21 1554     Clinical  Impression Statement Pt refer with diagnosis of R 3rd digit trigger finger and  PIP flexor contracture -pt is about 2 -3 months our from injury - pt tenderness over A1pulley decrease from 7-8/10 to 3/10 this date and no triggering or locking report. Pt with increase wrist AROM and and 3rd digit extnetion with less pain on volar hand and forearm- only volar PIP and DIP - Flexion about same but extention and pain improved - Tenderness over 3rd A1pulley decreased and trigger points over flexors in forearm  improved  -Pt to cont to keep pain under 2-3/10 - with use and AROM - and  cont to decrease pain, edema and increase AROM painfree - with increase pain and edema and decrease ROM and strength pt limited in functional use of R dominant hand and can benefit from skilled OT services    OT Occupational Profile and History Problem Focused Assessment - Including review of records relating to presenting problem    Occupational performance deficits (Please refer to evaluation for details): ADL's;IADL's;Work;Play;Leisure    Body Structure / Function / Physical Skills ADL;Decreased knowledge of precautions;Flexibility;IADL;ROM;Edema;UE functional use;Pain;Strength    Rehab Potential Good    Clinical Decision Making Limited treatment options, no task modification necessary    Comorbidities Affecting Occupational Performance: None    Modification or Assistance to Complete Evaluation  No modification of tasks or assist necessary to complete eval    OT Frequency 1x / week    OT Duration 6 weeks    OT Treatment/Interventions Self-care/ADL training;Cryotherapy;Iontophoresis;Contrast Bath;Fluidtherapy;DME and/or AE instruction;Splinting;Therapeutic exercise;Manual Therapy;Passive range of motion    Consulted and Agree with Plan of Care Patient             Patient will benefit from skilled therapeutic intervention in order to improve the following deficits and impairments:   Body Structure / Function / Physical  Skills: ADL, Decreased knowledge of precautions, Flexibility, IADL, ROM, Edema, UE functional use, Pain, Strength       Visit Diagnosis: Pain in right finger(s)  Pain in right forearm  Stiffness of right wrist, not elsewhere classified  Muscle weakness (generalized)  Stiffness of right hand, not elsewhere classified    Problem List Patient Active Problem List   Diagnosis Date Noted   Diverticulitis 07/02/2017    Rosalyn Gess, OTR/L,CLT 08/28/2021, 3:59 PM  [place] [address] [place], [address] Phone: [phone]   Fax:  [phone]  Name: TABBETHA KUTSCHER MRN: [id number] Date of Birth: Jun 10, 1946

## 2021-08-31 ENCOUNTER — Ambulatory Visit: Payer: Medicare PPO | Admitting: Occupational Therapy

## 2021-08-31 DIAGNOSIS — M79631 Pain in right forearm: Secondary | ICD-10-CM

## 2021-08-31 DIAGNOSIS — M6281 Muscle weakness (generalized): Secondary | ICD-10-CM

## 2021-08-31 DIAGNOSIS — M79644 Pain in right finger(s): Secondary | ICD-10-CM

## 2021-08-31 DIAGNOSIS — M25641 Stiffness of right hand, not elsewhere classified: Secondary | ICD-10-CM

## 2021-08-31 DIAGNOSIS — M25631 Stiffness of right wrist, not elsewhere classified: Secondary | ICD-10-CM

## 2021-08-31 NOTE — Therapy (Signed)
[facility] [address], [place], [address] Phone: [phone]   Fax:  [phone]  Occupational Therapy Treatment  Patient Details  Name: Whitney Liu MRN: [id number] Date of Birth: December 29, 1945 Referring Provider (OT): DR Rudene Christians   Encounter Date: 08/31/2021   OT End of Session - 08/31/21 1344     Visit Number 5    Number of Visits 6    Date for OT Re-Evaluation 09/22/21    OT Start Time 6384    OT Stop Time 1355    OT Time Calculation (min) 40 min    Activity Tolerance Patient tolerated treatment well    Behavior During Therapy [facility] for tasks assessed/performed             Past Medical History:  Diagnosis Date   Arthritis    Basal cell carcinoma 06/17/2013   Left nasal canthus.  Mohs   Basal cell carcinoma 02/08/2020   Right post. thigh Superficial and nodular patterns. EDC.   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 Late lesion with regression.   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 WD SCC, consistent with KA. Excised 07/02/2013.    Past Surgical History:  Procedure Laterality Date   ABDOMINAL HYSTERECTOMY     APPENDECTOMY     AXILLARY LYMPH NODE BIOPSY Right    BREAST BIOPSY Right 2014   neg   CATARACT EXTRACTION W/PHACO Right 07/10/2017   Procedure: CATARACT EXTRACTION PHACO AND INTRAOCULAR LENS PLACEMENT (IOC);  Surgeon: Leandrew Koyanagi, MD;  Location: [facility];  Service: Ophthalmology;  Laterality: Right;  IVA TOPICAL RIGHT   CATARACT EXTRACTION W/PHACO Left 06/10/2018   Procedure: CATARACT EXTRACTION PHACO AND INTRAOCULAR LENS PLACEMENT ([place]) LEFT IVA TOPICAL;  Surgeon: Leandrew Koyanagi, MD;  Location: [facility];  Service: Ophthalmology;  Laterality: Left;   OOPHORECTOMY      There were no vitals filed for this  visit.   Subjective Assessment - 08/31/21 1342     Subjective  Doing better- I can bend it in the morning and more straight - and can bend my wrist and fingers back and no pain in forearm    Pertinent History Whitney Liu is a 75 y.o. female here today.     The patient presents for evaluation of right long finger pain and difficulty with extension. She had prior x-rays that Dr. Rosalia Hammers had done. She refused an injection when he saw her. X-rays reviewed today from 06/29/2021. Right long finger shows no significant degenerative changes. No acute process.    The patient states her right long finger is bothering her due to her being right-hand dominant. 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 She has been using cocoa butter on her right long finger, but it was shooting pain all the way up at one time. She has had symptoms for more than 2 months.REfer to OT    Patient Stated Goals Want the pain and motion better in my dominant hand in the middle finger so I can use it with grip, writing , computer , eating, squeez    Currently in Pain? Yes    Pain Score 5     Pain Location Hand    Pain Orientation Right    Pain Descriptors /  Indicators Aching;Tightness    Pain Type Acute pain    Pain Onset More than a month ago    Pain Frequency Intermittent                      AROM at 3rd MC 85 and PIP 95- extention at PIP -15 but no  pain and great progress in composite extention of wrist and digits , less pain  on volar forearm , wrist and 3rd digit     Soft massage over hand , palm and forearm done by OT - LIght graston tool nr 2 done sweeping  volar hand and 3rd digit- and gentle traction, and massage to lateral bands of PIP   And can cont to do at home rolling over foam roller - to cont HEP for slight extention  last time  MC flexion  at 90 with PIP extention - light PROM - pain less than 2-3/10  Full extnetion  this session    AROM tendon glides - block -  10 reps but composite fist to foam roller - pain under 2-3/10   And opposition to all digits- 10 Reps  Pain free  Keep pain under 2/10   Enlarge grip and use palm or larger joints to pick up or carry objects     skin check done - tolerated well - pt to keep on for hour again - no triggering report and decrease pain with use    OT Treatments/Exercises (OP) - 08/31/21 0001       Iontophoresis   Type of Iontophoresis Dexamethasone    Location 3rd A1pulley on L    Dose 2.0currnt, , med patch    Time 19                    OT Education - 08/31/21 1343     Education Details progress and HEP    Person(s) Educated Patient    Methods Explanation;Demonstration;Tactile cues;Verbal cues;Handout    Comprehension Verbal cues required;Returned demonstration;Verbalized understanding                 OT Long Term Goals - 08/11/21 1939       OT LONG TERM GOAL #1   Title Pt to be independent in HEP to decrease pain and edema , increase AROM in 3rd digit to touch palm without increase symptoms    Baseline MC 80, PIP 85 pain increase 3-8/10 - cannot touch palm - no knowledge of HEP    Time 3    Period Weeks    Status New    Target Date 09/01/21      OT LONG TERM GOAL #2   Title Pt R hand 3rd digit increase to WNL to use in light ADL's and computer with pain less than 2./10 at the worse    Baseline Decrease flexion 27 MC , PIP 85 - PIP  ext -10 - pain 8/10 increase with fisting    Time 4    Period Weeks    Status New    Target Date 09/08/21      OT LONG TERM GOAL #3   Title Pt to have AROM in R hand and wrist WNL without increase symptoms to return to prior level of function writing , computer , carry groceries    Baseline pain 8/10 increase with fisting - MC 80, PIP 85 -    Time 6    Period Weeks    Status New  Target Date 09/22/21                   Plan - 08/31/21 1344     Clinical Impression Statement Pt refer with diagnosis of R 3rd digit trigger  finger and  PIP flexor contracture -pt is about 3 months our from injury - pt tenderness over A1pulley decrease from 7-8/10 to 3-5/10 this date and no triggering or locking report. Pt with increase wrist and digits composite extention AROM  with less pain on volar hand and forearm- only volar PIP and DIP but improvin  - Flexion  this date 26 and MC and PIP 95- able to get full extention with soft tissue and no modalities  - Tenderness over 3rd A1pulley decreased and trigger points over flexors in forearm and 3rd digit  improved  -Pt to cont to keep pain under 2-3/10 - with use and AROM - and  cont to decrease pain, edema and increase AROM painfree - with increase pain and edema and decrease ROM and strength pt limited in functional use of R dominant hand and can benefit from skilled OT services    OT Occupational Profile and History Problem Focused Assessment - Including review of records relating to presenting problem    Occupational performance deficits (Please refer to evaluation for details): ADL's;IADL's;Work;Play;Leisure    Body Structure / Function / Physical Skills ADL;Decreased knowledge of precautions;Flexibility;IADL;ROM;Edema;UE functional use;Pain;Strength    Rehab Potential Good    Clinical Decision Making Limited treatment options, no task modification necessary    Comorbidities Affecting Occupational Performance: None    Modification or Assistance to Complete Evaluation  No modification of tasks or assist necessary to complete eval    OT Frequency 1x / week    OT Duration 6 weeks    OT Treatment/Interventions Self-care/ADL training;Cryotherapy;Iontophoresis;Contrast Bath;Fluidtherapy;DME and/or AE instruction;Splinting;Therapeutic exercise;Manual Therapy;Passive range of motion    Consulted and Agree with Plan of Care Patient             Patient will benefit from skilled therapeutic intervention in order to improve the following deficits and impairments:   Body Structure /  Function / Physical Skills: ADL, Decreased knowledge of precautions, Flexibility, IADL, ROM, Edema, UE functional use, Pain, Strength       Visit Diagnosis: Pain in right finger(s)  Pain in right forearm  Stiffness of right wrist, not elsewhere classified  Muscle weakness (generalized)  Stiffness of right hand, not elsewhere classified    Problem List Patient Active Problem List   Diagnosis Date Noted   Diverticulitis 07/02/2017    Rosalyn Gess, OTR/L,CLT 08/31/2021, 2:00 PM  [facility] [address] [place], [address] Phone: [phone]   Fax:  [phone]  Name: Whitney Liu MRN: [id number] Date of Birth: 1946-05-01

## 2021-09-04 ENCOUNTER — Ambulatory Visit: Payer: Medicare PPO | Attending: Orthopedic Surgery | Admitting: Occupational Therapy

## 2021-09-04 DIAGNOSIS — M6281 Muscle weakness (generalized): Secondary | ICD-10-CM | POA: Insufficient documentation

## 2021-09-04 DIAGNOSIS — M25631 Stiffness of right wrist, not elsewhere classified: Secondary | ICD-10-CM | POA: Insufficient documentation

## 2021-09-04 DIAGNOSIS — M25641 Stiffness of right hand, not elsewhere classified: Secondary | ICD-10-CM | POA: Diagnosis present

## 2021-09-04 DIAGNOSIS — M79644 Pain in right finger(s): Secondary | ICD-10-CM | POA: Diagnosis present

## 2021-09-04 DIAGNOSIS — M79631 Pain in right forearm: Secondary | ICD-10-CM | POA: Insufficient documentation

## 2021-09-04 NOTE — Therapy (Signed)
[facility] [address] [place], [address] Phone: [phone]   Fax:  [phone]  Occupational Therapy Treatment  Patient Details  Name: Whitney Liu MRN: [id number] Date of Birth: 1946/04/18 Referring Provider (OT): DR Rudene Christians   Encounter Date: 09/04/2021   OT End of Session - 09/04/21 1401     Visit Number 6    Number of Visits 6    Date for OT Re-Evaluation 09/22/21    OT Start Time 1349    OT Stop Time 1434    OT Time Calculation (min) 45 min    Activity Tolerance Patient tolerated treatment well    Behavior During Therapy [facility] for tasks assessed/performed             Past Medical History:  Diagnosis Date   Arthritis    Basal cell carcinoma 06/17/2013   Left nasal canthus.  Mohs   Basal cell carcinoma 02/08/2020   Right post. thigh Superficial and nodular patterns. EDC.   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 Late lesion with regression.   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 WD SCC, consistent with KA. Excised 07/02/2013.    Past Surgical History:  Procedure Laterality Date   ABDOMINAL HYSTERECTOMY     APPENDECTOMY     AXILLARY LYMPH NODE BIOPSY Right    BREAST BIOPSY Right 2014   neg   CATARACT EXTRACTION W/PHACO Right 07/10/2017   Procedure: CATARACT EXTRACTION PHACO AND INTRAOCULAR LENS PLACEMENT (IOC);  Surgeon: Leandrew Koyanagi, MD;  Location: [facility];  Service: Ophthalmology;  Laterality: Right;  IVA TOPICAL RIGHT   CATARACT EXTRACTION W/PHACO Left 06/10/2018   Procedure: CATARACT EXTRACTION PHACO AND INTRAOCULAR LENS PLACEMENT ([place]) LEFT IVA TOPICAL;  Surgeon: Leandrew Koyanagi, MD;  Location: [facility];  Service: Ophthalmology;  Laterality: Left;   OOPHORECTOMY      There were no vitals filed for this  visit.   Subjective Assessment - 09/04/21 1357     Subjective  I slept wrong because my finger was little sore and stiff today - but otherwise was doing well until yesterday    Pertinent History Whitney Liu is a 75 y.o. female here today.     The patient presents for evaluation of right long finger pain and difficulty with extension. She had prior x-rays that Dr. Rosalia Hammers had done. She refused an injection when he saw her. X-rays reviewed today from 06/29/2021. Right long finger shows no significant degenerative changes. No acute process.    The patient states her right long finger is bothering her due to her being right-hand dominant. 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 She has been using cocoa butter on her right long finger, but it was shooting pain all the way up at one time. She has had symptoms for more than 2 months.REfer to OT    Patient Stated Goals Want the pain and motion better in my dominant hand in the middle finger so I can use it with grip, writing , computer , eating, squeez    Currently in Pain? Yes    Pain Location Hand    Pain Orientation Right    Pain Descriptors / Indicators Aching;Tightness    Pain Type Acute pain    Pain  Onset More than a month ago    Pain Frequency Intermittent                          OT Treatments/Exercises (OP) - 09/04/21 0001       Modalities   Modalities Moist Heat      Moist Heat Therapy   Number Minutes Moist Heat 6 Minutes    Moist Heat Location --   prior to soft tissue     Iontophoresis   Type of Iontophoresis Dexamethasone    Location 3rd A1pulley    Dose 2.0currnt, , med patch    Time 19             AROM at 3rd MC 85 and PIP 95- extention at PIP -12 coming in - some pain over volar DIP and PIP - but great progress in composite extention of wrist and digits , less pain  on volar forearm and wrist     Soft massage over hand , palm and forearm done by OT - LIght  graston tool nr 2 done sweeping  volar hand and 3rd digit- and gentle traction, and massage to lateral bands of PIP   And can cont to do at home rolling over foam roller - to cont HEP for slight extention  last time  MC flexion  at 90 with PIP extention - light PROM - pain less than 2-3/10  Full extnetion  this session  And then focus on intrinsic fist - prior to composite    AROM tendon glides - block - 10 reps but composite fist to foam roller - pain under 2-3/10   And opposition to all digits- 10 Reps  Pain free  Keep pain under 2/10   Enlarge grip and use palm or larger joints to pick up or carry objects       skin check done - tolerated well - pt to keep on for hour again - no triggering report and decrease pain with use        OT Education - 09/04/21 1401     Education Details progress and HEP    Person(s) Educated Patient    Methods Explanation;Demonstration;Tactile cues;Verbal cues;Handout    Comprehension Verbal cues required;Returned demonstration;Verbalized understanding                 OT Long Term Goals - 09/04/21 1420       OT LONG TERM GOAL #1   Title Pt to be independent in HEP to decrease pain and edema , increase AROM in 3rd digit to touch palm without increase symptoms    Baseline MC 80, PIP 85 pain increase 3-8/10 - cannot touch palm - no knowledge of HEP  NOW great progress still tenderness 2-5/10 , and flexion MC 85, PIP 95- no triggering , composite extentoin no pain    Time 3    Period Weeks    Status On-going    Target Date 09/25/21      OT LONG TERM GOAL #2   Title Pt R hand 3rd digit increase to WNL to use in light ADL's and computer with pain less than 2./10 at the worse    Baseline Decrease flexion 80 MC , PIP 85 - PIP  ext -10 - pain 8/10 increase with fisting - NOW pain much better- no triggering - tenderness 2-5/10 over volar DIP and A1pulley - flexion 85, 95, full extention in session- no pain    Time  3    Period Weeks    Status  On-going    Target Date 09/25/21      OT LONG TERM GOAL #3   Title Pt to have AROM in R hand and wrist WNL without increase symptoms to return to prior level of function writing , computer , carry groceries    Baseline pain 8/10 increase with fisting - MC 80, PIP 85 -  NOW pain decrease 2-5/10 - progressing well    Time 3    Period Weeks    Status On-going    Target Date 09/25/21                   Plan - 09/04/21 1401     Clinical Impression Statement Pt refer with diagnosis of R 3rd digit trigger finger and  PIP flexor contracture -pt is about 3 months our from injury - pt tenderness over A1pulley decrease from 7-8/10 to 3-5/10 this date and no triggering or locking report. Pt with increase wrist and digits composite extention AROM  with less pain on volar hand and forearm- only volar PIP and DIP but improvin  - Flexion  this date 87 and MC and PIP 95- able to get full extention with soft tissue and no modalities  - Tenderness over 3rd A1pulley decreased and trigger points over flexors in forearm and 3rd digit  improved  -Pt to cont to keep pain under 2-3/10 - with use and AROM - and  cont to decrease pain, edema and increase AROM painfree - with increase pain and edema and decrease ROM and strength pt limited in functional use of R dominant hand and can benefit from skilled OT services    OT Occupational Profile and History Problem Focused Assessment - Including review of records relating to presenting problem    Occupational performance deficits (Please refer to evaluation for details): ADL's;IADL's;Work;Play;Leisure    Body Structure / Function / Physical Skills ADL;Decreased knowledge of precautions;Flexibility;IADL;ROM;Edema;UE functional use;Pain;Strength    Rehab Potential Good    Clinical Decision Making Limited treatment options, no task modification necessary    Comorbidities Affecting Occupational Performance: None    Modification or Assistance to Complete Evaluation  No  modification of tasks or assist necessary to complete eval    OT Frequency 2x / week    OT Duration --   3 wks   OT Treatment/Interventions Self-care/ADL training;Cryotherapy;Iontophoresis;Contrast Bath;Fluidtherapy;DME and/or AE instruction;Splinting;Therapeutic exercise;Manual Therapy;Passive range of motion    Consulted and Agree with Plan of Care Patient             Patient will benefit from skilled therapeutic intervention in order to improve the following deficits and impairments:   Body Structure / Function / Physical Skills: ADL, Decreased knowledge of precautions, Flexibility, IADL, ROM, Edema, UE functional use, Pain, Strength       Visit Diagnosis: Pain in right finger(s) - Plan: Ot plan of care cert/re-cert  Pain in right forearm - Plan: Ot plan of care cert/re-cert  Stiffness of right wrist, not elsewhere classified - Plan: Ot plan of care cert/re-cert  Stiffness of right hand, not elsewhere classified - Plan: Ot plan of care cert/re-cert    Problem List Patient Active Problem List   Diagnosis Date Noted   Diverticulitis 07/02/2017    Rosalyn Gess, OTR/L,CLT 09/04/2021, 2:25 PM  [facility] [address], [place], [address] Phone: [phone]   Fax:  [phone]  Name: Whitney Grandpre  Liu MRN: [id number] Date of Birth: Feb 22, 1946

## 2021-09-06 ENCOUNTER — Ambulatory Visit: Payer: Medicare PPO | Admitting: Occupational Therapy

## 2021-09-06 DIAGNOSIS — M25641 Stiffness of right hand, not elsewhere classified: Secondary | ICD-10-CM

## 2021-09-06 DIAGNOSIS — M79644 Pain in right finger(s): Secondary | ICD-10-CM

## 2021-09-06 DIAGNOSIS — M6281 Muscle weakness (generalized): Secondary | ICD-10-CM

## 2021-09-06 DIAGNOSIS — M25631 Stiffness of right wrist, not elsewhere classified: Secondary | ICD-10-CM

## 2021-09-06 DIAGNOSIS — M79631 Pain in right forearm: Secondary | ICD-10-CM

## 2021-09-06 NOTE — Therapy (Signed)
[facility] [address], [place], [address] Phone: [phone]   Fax:  [phone]  Occupational Therapy Treatment  Patient Details  Name: Whitney Liu MRN: [id number] Date of Birth: 02-21-1946 Referring Provider (OT): DR Rudene Christians   Encounter Date: 09/06/2021   OT End of Session - 09/06/21 1529     Visit Number 7    Number of Visits 11    Date for OT Re-Evaluation 10/26/21    OT Start Time 1450    OT Stop Time 1521    OT Time Calculation (min) 31 min    Activity Tolerance Patient tolerated treatment well    Behavior During Therapy [facility] for tasks assessed/performed             Past Medical History:  Diagnosis Date   Arthritis    Basal cell carcinoma 06/17/2013   Left nasal canthus.  Mohs   Basal cell carcinoma 02/08/2020   Right post. thigh Superficial and nodular patterns. EDC.   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 Late lesion with regression.   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 WD SCC, consistent with KA. Excised 07/02/2013.    Past Surgical History:  Procedure Laterality Date   ABDOMINAL HYSTERECTOMY     APPENDECTOMY     AXILLARY LYMPH NODE BIOPSY Right    BREAST BIOPSY Right 2014   neg   CATARACT EXTRACTION W/PHACO Right 07/10/2017   Procedure: CATARACT EXTRACTION PHACO AND INTRAOCULAR LENS PLACEMENT (IOC);  Surgeon: Leandrew Koyanagi, MD;  Location: [place];  Service: Ophthalmology;  Laterality: Right;  IVA TOPICAL RIGHT   CATARACT EXTRACTION W/PHACO Left 06/10/2018   Procedure: CATARACT EXTRACTION PHACO AND INTRAOCULAR LENS PLACEMENT ([place]) LEFT IVA TOPICAL;  Surgeon: Leandrew Koyanagi, MD;  Location: [place];  Service: Ophthalmology;  Laterality: Left;   OOPHORECTOMY      There were no vitals filed for this  visit.   Subjective Assessment - 09/06/21 1526     Subjective  Doing great progress - still tender but better than last time on the finger - bottom still tender - but 80-85% better    Pertinent History Whitney Liu is a 75 y.o. female here today.     The patient presents for evaluation of right long finger pain and difficulty with extension. She had prior x-rays that Dr. Rosalia Hammers had done. She refused an injection when he saw her. X-rays reviewed today from 06/29/2021. Right long finger shows no significant degenerative changes. No acute process.    The patient states her right long finger is bothering her due to her being right-hand dominant. 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 She has been using cocoa butter on her right long finger, but it was shooting pain all the way up at one time. She has had symptoms for more than 2 months.REfer to OT    Patient Stated Goals Want the pain and motion better in my dominant hand in the middle finger so I can use it with grip, writing , computer , eating, squeez    Currently in Pain? Yes    Pain Score 3    volar 3rd DIP and PIP   Pain Location Hand    Pain Orientation Right    Pain  Descriptors / Indicators Aching;Tightness    Pain Type Acute pain    Pain Onset More than a month ago                [facility] OT Assessment - 09/06/21 0001       Right Hand AROM   R Long  MCP 0-90 85 Degrees    R Long PIP 0-100 95 Degrees   0                 Gentle traction, massage to 3rd digit- full extnetion of PIP pain free without modalities MC flexion  at 90 with PIP extention - light PROM - pain less than 2-3/10  Full extnetion  this session again   AROM tendon glides - block MC flexion and PIP extention  Intrinsic fist block prior to composite flexion   And opposition to all digits- 10 Reps  Pain free  Keep pain under 2/10   Enlarge grip and use palm or larger joints to pick up or carry objects       skin  check done - tolerated well - pt to keep on for hour again - no issues        OT Treatments/Exercises (OP) - 09/06/21 0001       Iontophoresis   Type of Iontophoresis Dexamethasone    Location 3rd A1pulley    Dose 2.0currnt, , med patch    Time 19                    OT Education - 09/06/21 1529     Education Details progress and HEP    Person(s) Educated Patient                 OT Long Term Goals - 09/04/21 1420       OT LONG TERM GOAL #1   Title Pt to be independent in HEP to decrease pain and edema , increase AROM in 3rd digit to touch palm without increase symptoms    Baseline MC 80, PIP 85 pain increase 3-8/10 - cannot touch palm - no knowledge of HEP  NOW great progress still tenderness 2-5/10 , and flexion MC 85, PIP 95- no triggering , composite extentoin no pain    Time 3    Period Weeks    Status On-going    Target Date 09/25/21      OT LONG TERM GOAL #2   Title Pt R hand 3rd digit increase to WNL to use in light ADL's and computer with pain less than 2./10 at the worse    Baseline Decrease flexion 80 MC , PIP 85 - PIP  ext -10 - pain 8/10 increase with fisting - NOW pain much better- no triggering - tenderness 2-5/10 over volar DIP and A1pulley - flexion 85, 95, full extention in session- no pain    Time 3    Period Weeks    Status On-going    Target Date 09/25/21      OT LONG TERM GOAL #3   Title Pt to have AROM in R hand and wrist WNL without increase symptoms to return to prior level of function writing , computer , carry groceries    Baseline pain 8/10 increase with fisting - MC 80, PIP 85 -  NOW pain decrease 2-5/10 - progressing well    Time 3    Period Weeks    Status On-going    Target Date 09/25/21  Plan - 09/06/21 1530     Clinical Impression Statement Pt refer with diagnosis of R 3rd digit trigger finger and  PIP flexor contracture -pt is about 3 months our from injury - pt tenderness over A1pulley  decrease from 7-8/10 to 3-5/10 this date and no triggering or locking report. Pt with increase wrist and digits composite extention AROM  with less pain on volar hand and forearm- only volar PIP and DIP but improvin  - Flexion  this date 48 and MC and PIP 95- able to get full extention with soft tissue and no modalities  - Tenderness over 3rd A1pulley decreased and trigger points over flexors in forearm and 3rd digit  improved - pt to focus on PIP extention and intrinsic fist prior to composite - and not tight fist yet  -Pt to cont to keep pain under 2-3/10 - with use and AROM - and  cont to decrease pain, edema and increase AROM painfree - with increase pain and edema and decrease ROM and strength pt limited in functional use of R dominant hand and can benefit from skilled OT services    OT Occupational Profile and History Problem Focused Assessment - Including review of records relating to presenting problem    Occupational performance deficits (Please refer to evaluation for details): ADL's;IADL's;Work;Play;Leisure    Body Structure / Function / Physical Skills ADL;Decreased knowledge of precautions;Flexibility;IADL;ROM;Edema;UE functional use;Pain;Strength    Rehab Potential Good    Clinical Decision Making Limited treatment options, no task modification necessary    Comorbidities Affecting Occupational Performance: None    Modification or Assistance to Complete Evaluation  No modification of tasks or assist necessary to complete eval    OT Frequency 2x / week   decrease to 1 x wk , biweekly   OT Duration 4 weeks    OT Treatment/Interventions Self-care/ADL training;Cryotherapy;Iontophoresis;Contrast Bath;Fluidtherapy;DME and/or AE instruction;Splinting;Therapeutic exercise;Manual Therapy;Passive range of motion    Consulted and Agree with Plan of Care Patient             Patient will benefit from skilled therapeutic intervention in order to improve the following deficits and impairments:    Body Structure / Function / Physical Skills: ADL, Decreased knowledge of precautions, Flexibility, IADL, ROM, Edema, UE functional use, Pain, Strength       Visit Diagnosis: Pain in right finger(s)  Pain in right forearm  Stiffness of right wrist, not elsewhere classified  Stiffness of right hand, not elsewhere classified  Muscle weakness (generalized)    Problem List Patient Active Problem List   Diagnosis Date Noted   Diverticulitis 07/02/2017    Rosalyn Gess, OTR/L,CLT 09/06/2021, 8:52 PM  [place] [facility] [address], [place], [address] Phone: [phone]   Fax:  [phone]  Name: Whitney Liu MRN: [id number] Date of Birth: 1946/11/14

## 2021-09-07 ENCOUNTER — Ambulatory Visit
Admission: RE | Admit: 2021-09-07 | Discharge: 2021-09-07 | Disposition: A | Discharge status: Home / Self Care | Payer: Medicare PPO | Source: Ambulatory Visit | Attending: Family Medicine | Admitting: Family Medicine

## 2021-09-07 ENCOUNTER — Other Ambulatory Visit: Payer: Self-pay

## 2021-09-07 DIAGNOSIS — Z1231 Encounter for screening mammogram for malignant neoplasm of breast: Secondary | ICD-10-CM

## 2021-09-11 ENCOUNTER — Ambulatory Visit: Payer: Medicare PPO | Admitting: Occupational Therapy

## 2021-09-11 DIAGNOSIS — M25641 Stiffness of right hand, not elsewhere classified: Secondary | ICD-10-CM

## 2021-09-11 DIAGNOSIS — M79631 Pain in right forearm: Secondary | ICD-10-CM

## 2021-09-11 DIAGNOSIS — M79644 Pain in right finger(s): Secondary | ICD-10-CM | POA: Diagnosis not present

## 2021-09-11 DIAGNOSIS — M6281 Muscle weakness (generalized): Secondary | ICD-10-CM

## 2021-09-11 DIAGNOSIS — M25631 Stiffness of right wrist, not elsewhere classified: Secondary | ICD-10-CM

## 2021-09-11 NOTE — Therapy (Signed)
[place] [facility] [address] [place], [address] Phone: [phone]   Fax:  [phone]  Occupational Therapy Treatment  Patient Details  Name: Whitney Liu MRN: [id number] Date of Birth: 1946-09-25 Referring Provider (OT): DR Rudene Christians   Encounter Date: 09/11/2021   OT End of Session - 09/11/21 2109     Visit Number 8    Number of Visits 11    Date for OT Re-Evaluation 10/26/21    OT Start Time 1645    OT Stop Time 1713    OT Time Calculation (min) 28 min    Activity Tolerance Patient tolerated treatment well    Behavior During Therapy [facility] for tasks assessed/performed             Past Medical History:  Diagnosis Date   Arthritis    Basal cell carcinoma 06/17/2013   Left nasal canthus.  Mohs   Basal cell carcinoma 02/08/2020   Right post. thigh Superficial and nodular patterns. EDC.   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 Late lesion with regression.   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 WD SCC, consistent with KA. Excised 07/02/2013.    Past Surgical History:  Procedure Laterality Date   ABDOMINAL HYSTERECTOMY     APPENDECTOMY     AXILLARY LYMPH NODE BIOPSY Right    BREAST BIOPSY Right 2014   neg   CATARACT EXTRACTION W/PHACO Right 07/10/2017   Procedure: CATARACT EXTRACTION PHACO AND INTRAOCULAR LENS PLACEMENT (IOC);  Surgeon: Leandrew Koyanagi, MD;  Location: [place];  Service: Ophthalmology;  Laterality: Right;  IVA TOPICAL RIGHT   CATARACT EXTRACTION W/PHACO Left 06/10/2018   Procedure: CATARACT EXTRACTION PHACO AND INTRAOCULAR LENS PLACEMENT ([place]) LEFT IVA TOPICAL;  Surgeon: Leandrew Koyanagi, MD;  Location: [place];  Service: Ophthalmology;  Laterality: Left;   OOPHORECTOMY      There were no vitals filed for this  visit.   Subjective Assessment - 09/11/21 2106     Subjective  I was doing good but then since yesterday the furthest joint hurting again- tend  - pain when trying to straighten  the middle joint - pain in furthest one - palm and forearm still good    Pertinent History Whitney Liu is a 75 y.o. female here today.     The patient presents for evaluation of right long finger pain and difficulty with extension. She had prior x-rays that Dr. Rosalia Hammers had done. She refused an injection when he saw her. X-rays reviewed today from 06/29/2021. Right long finger shows no significant degenerative changes. No acute process.    The patient states her right long finger is bothering her due to her being right-hand dominant. 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 She has been using cocoa butter on her right long finger, but it was shooting pain all the way up at one time. She has had symptoms for more than 2 months.REfer to OT    Patient Stated Goals Want the pain and motion better in my dominant hand in the middle finger so I can use it with grip, writing , computer , eating, squeez    Currently in Pain? Yes    Pain Score 5     Pain Location Finger (Comment which  one)    Pain Orientation Right    Pain Descriptors / Indicators Aching;Tender    Pain Type Acute pain    Pain Onset More than a month ago    Pain Frequency Intermittent                          OT Treatments/Exercises (OP) - 09/11/21 0001       Moist Heat Therapy   Number Minutes Moist Heat 6 Minutes    Moist Heat Location Hand            Gentle traction, massage to 3rd digit - using graston tool nr 2 over volar 4th ,brushing and over palm and volar forearm  full extnetion of PIP pain free- and tenderness over volar DIP decrease after graston and soft tissue  Ed pt to use heat or do soft tissue massage prior to stretches or PROM - and cont to work volar forearm MC flexion  at 90 with  PIP extention - light PROM - pain less than 2-3/10  Full extnetion  this session again   AROM tendon glides - block MC flexion and PIP extention  Intrinsic fist block prior to composite flexion - but don't try and touch palm  And opposition to all digits- 10 Reps  Pain free  Keep pain under 2/10   Enlarge grip and use palm or larger joints to pick up or carry objects            OT Education - 09/11/21 2109     Education Details progress and HEP    Person(s) Educated Patient    Methods Explanation;Demonstration;Tactile cues;Verbal cues;Handout    Comprehension Verbal cues required;Returned demonstration;Verbalized understanding                 OT Long Term Goals - 09/04/21 1420       OT LONG TERM GOAL #1   Title Pt to be independent in HEP to decrease pain and edema , increase AROM in 3rd digit to touch palm without increase symptoms    Baseline MC 80, PIP 85 pain increase 3-8/10 - cannot touch palm - no knowledge of HEP  NOW great progress still tenderness 2-5/10 , and flexion MC 85, PIP 95- no triggering , composite extentoin no pain    Time 3    Period Weeks    Status On-going    Target Date 09/25/21      OT LONG TERM GOAL #2   Title Pt R hand 3rd digit increase to WNL to use in light ADL's and computer with pain less than 2./10 at the worse    Baseline Decrease flexion 80 MC , PIP 85 - PIP  ext -10 - pain 8/10 increase with fisting - NOW pain much better- no triggering - tenderness 2-5/10 over volar DIP and A1pulley - flexion 85, 95, full extention in session- no pain    Time 3    Period Weeks    Status On-going    Target Date 09/25/21      OT LONG TERM GOAL #3   Title Pt to have AROM in R hand and wrist WNL without increase symptoms to return to prior level of function writing , computer , carry groceries    Baseline pain 8/10 increase with fisting - MC 80, PIP 85 -  NOW pain decrease 2-5/10 - progressing well    Time 3    Period Weeks    Status On-going  Target Date 09/25/21                   Plan - 09/11/21 2110     Clinical Impression Statement Pt refer with diagnosis of R 3rd digit trigger finger and  PIP flexor contracture -pt is about 3 months our from injury - pt tenderness over A1pulley decrease from 7-8/10 to 3/10 this date and no triggering or locking report. Pt with increase wrist and digits composite extention AROM  with less pain on volar hand and forearm- only  pain this date coming in is in the DIP more than the PIP - review with pt again to do some heat /contrast and soft tissue mobs to volar 3rd digit prior to PROM and stretches to PIP extention - otherwise cause some pain at the volar DIP flexor likd today- she did some chopping yesterday and made soup - in session after heat , graston tool and soft tissue / gentle stretch pt had full extention - Tenderness over 3rd A1pulley decreased and trigger points over flexors in forearm and 3rd digit  improved - pt to focus on PIP extention and intrinsic fist prior to composite - and not tight fist yet  -Pt to cont to keep pain under 2-3/10 - with use and AROM - and  cont to decrease pain, edema and increase AROM painfree - with increase pain and edema and decrease ROM and strength pt limited in functional use of R dominant hand and can benefit from skilled OT services    OT Occupational Profile and History Problem Focused Assessment - Including review of records relating to presenting problem    Occupational performance deficits (Please refer to evaluation for details): ADL's;IADL's;Work;Play;Leisure    Body Structure / Function / Physical Skills ADL;Decreased knowledge of precautions;Flexibility;IADL;ROM;Edema;UE functional use;Pain;Strength    Rehab Potential Good    Clinical Decision Making Limited treatment options, no task modification necessary    Comorbidities Affecting Occupational Performance: None    Modification or Assistance to Complete Evaluation  No modification of  tasks or assist necessary to complete eval    OT Frequency 2x / week    OT Duration 4 weeks    OT Treatment/Interventions Self-care/ADL training;Cryotherapy;Iontophoresis;Contrast Bath;Fluidtherapy;DME and/or AE instruction;Splinting;Therapeutic exercise;Manual Therapy;Passive range of motion    Consulted and Agree with Plan of Care Patient             Patient will benefit from skilled therapeutic intervention in order to improve the following deficits and impairments:   Body Structure / Function / Physical Skills: ADL, Decreased knowledge of precautions, Flexibility, IADL, ROM, Edema, UE functional use, Pain, Strength       Visit Diagnosis: Pain in right finger(s)  Pain in right forearm  Stiffness of right wrist, not elsewhere classified  Stiffness of right hand, not elsewhere classified  Muscle weakness (generalized)    Problem List Patient Active Problem List   Diagnosis Date Noted   Diverticulitis 07/02/2017    Rosalyn Gess, OTR/L,CLT 09/11/2021, 9:23 PM  [facility] [address] [place], [address] Phone: [phone]   Fax:  [phone]  Name: Whitney Liu MRN: [id number] Date of Birth: 05-21-1946

## 2021-09-13 ENCOUNTER — Ambulatory Visit: Payer: Medicare PPO | Admitting: Occupational Therapy

## 2021-09-14 ENCOUNTER — Ambulatory Visit: Payer: Medicare PPO | Admitting: Occupational Therapy

## 2021-09-14 DIAGNOSIS — M79644 Pain in right finger(s): Secondary | ICD-10-CM | POA: Diagnosis not present

## 2021-09-14 DIAGNOSIS — M25641 Stiffness of right hand, not elsewhere classified: Secondary | ICD-10-CM

## 2021-09-14 DIAGNOSIS — M6281 Muscle weakness (generalized): Secondary | ICD-10-CM

## 2021-09-14 DIAGNOSIS — M79631 Pain in right forearm: Secondary | ICD-10-CM

## 2021-09-14 DIAGNOSIS — M25631 Stiffness of right wrist, not elsewhere classified: Secondary | ICD-10-CM

## 2021-09-14 NOTE — Therapy (Signed)
[place] [facility] [address] [place], [address] Phone: [phone]   Fax:  [phone]  Occupational Therapy Treatment  Patient Details  Name: Whitney Liu MRN: [id number] Date of Birth: 14-Oct-1946 Referring Provider (OT): DR Rudene Christians   Encounter Date: 09/14/2021   OT End of Session - 09/14/21 1054     Visit Number 9    Number of Visits 11    Date for OT Re-Evaluation 10/26/21    OT Start Time 0910    OT Stop Time 1005    OT Time Calculation (min) 55 min    Activity Tolerance Patient tolerated treatment well    Behavior During Therapy [facility] for tasks assessed/performed             Past Medical History:  Diagnosis Date   Arthritis    Basal cell carcinoma 06/17/2013   Left nasal canthus.  Mohs   Basal cell carcinoma 02/08/2020   Right post. thigh Superficial and nodular patterns. EDC.   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 Late lesion with regression.   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 WD SCC, consistent with KA. Excised 07/02/2013.    Past Surgical History:  Procedure Laterality Date   ABDOMINAL HYSTERECTOMY     APPENDECTOMY     AXILLARY LYMPH NODE BIOPSY Right    BREAST BIOPSY Right 2014   neg   CATARACT EXTRACTION W/PHACO Right 07/10/2017   Procedure: CATARACT EXTRACTION PHACO AND INTRAOCULAR LENS PLACEMENT (IOC);  Surgeon: Leandrew Koyanagi, MD;  Location: [place];  Service: Ophthalmology;  Laterality: Right;  IVA TOPICAL RIGHT   CATARACT EXTRACTION W/PHACO Left 06/10/2018   Procedure: CATARACT EXTRACTION PHACO AND INTRAOCULAR LENS PLACEMENT ([place]) LEFT IVA TOPICAL;  Surgeon: Leandrew Koyanagi, MD;  Location: [place];  Service: Ophthalmology;  Laterality: Left;   OOPHORECTOMY      There were no vitals filed for this  visit.   Subjective Assessment - 09/14/21 1052     Subjective  I did what you told me last time -and used heat and massage- so much better than it was and in the palm and forearm - now still between that middle and tip joint    Pertinent History Whitney Liu is a 75 y.o. female here today.     The patient presents for evaluation of right long finger pain and difficulty with extension. She had prior x-rays that Dr. Rosalia Hammers had done. She refused an injection when he saw her. X-rays reviewed today from 06/29/2021. Right long finger shows no significant degenerative changes. No acute process.    The patient states her right long finger is bothering her due to her being right-hand dominant. 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 She has been using cocoa butter on her right long finger, but it was shooting pain all the way up at one time. She has had symptoms for more than 2 months.REfer to OT    Patient Stated Goals Want the pain and motion better in my dominant hand in the middle finger so I can use it with grip, writing , computer , eating, squeez    Currently in Pain? Yes    Pain Score 2     Pain Location --   volar 3rd  PIP   Pain Orientation Right    Pain Descriptors / Indicators Aching;Tender    Pain Type Acute pain    Pain Onset More than a month ago    Pain Frequency Intermittent                       skin check done prior to ionto and pt to keep on for hour - pt tolerated this time lower current - but denied having lotion on    OT Treatments/Exercises (OP) - 09/14/21 0001       Moist Heat Therapy   Number Minutes Moist Heat 6 Minutes    Moist Heat Location Hand      Iontophoresis   Type of Iontophoresis Dexamethasone    Location 3rd A1pulley    Dose 1.5 currnt, , med patch    Time 24              Gentle traction, massage to 3rd digit - using graston tool nr 2 over volar 4th ,brushing and over palm and volar forearm  full  extnetion of PIP pain free- and tenderness over volar  PIP decrease after graston and soft tissue  Ed pt to use heat or do soft tissue massage prior to stretches or PROM - and cont to work volar forearm MC flexion  at 90 with PIP extention - light PROM - pain less than 2/10  Full extnetion  this session again   AROM tendon glides - block MC flexion and PIP extention - AAROM to 90 degrees for MC flexion Intrinsic fist block prior to composite flexion - but don't try and touch palm  And opposition to all digits- 10 Reps  Pain free  Keep pain under 2/10   Enlarge grip and use palm or larger joints to pick up or carry objects      OT Education - 09/14/21 1054     Education Details progress and HEP    Person(s) Educated Patient    Methods Explanation;Demonstration;Tactile cues;Verbal cues;Handout    Comprehension Verbal cues required;Returned demonstration;Verbalized understanding                 OT Long Term Goals - 09/04/21 1420       OT LONG TERM GOAL #1   Title Pt to be independent in HEP to decrease pain and edema , increase AROM in 3rd digit to touch palm without increase symptoms    Baseline MC 80, PIP 85 pain increase 3-8/10 - cannot touch palm - no knowledge of HEP  NOW great progress still tenderness 2-5/10 , and flexion MC 85, PIP 95- no triggering , composite extentoin no pain    Time 3    Period Weeks    Status On-going    Target Date 09/25/21      OT LONG TERM GOAL #2   Title Pt R hand 3rd digit increase to WNL to use in light ADL's and computer with pain less than 2./10 at the worse    Baseline Decrease flexion 80 MC , PIP 85 - PIP  ext -10 - pain 8/10 increase with fisting - NOW pain much better- no triggering - tenderness 2-5/10 over volar DIP and A1pulley - flexion 85, 95, full extention in session- no pain    Time 3    Period Weeks    Status On-going    Target Date 09/25/21      OT LONG TERM GOAL #3   Title Pt to have  AROM in R hand and wrist WNL  without increase symptoms to return to prior level of function writing , computer , carry groceries    Baseline pain 8/10 increase with fisting - MC 80, PIP 85 -  NOW pain decrease 2-5/10 - progressing well    Time 3    Period Weeks    Status On-going    Target Date 09/25/21                   Plan - 09/14/21 1055     Clinical Impression Statement Pt refer with diagnosis of R 3rd digit trigger finger and  PIP flexor contracture -pt is about 3 months our from injury - pt tenderness over A1pulley decrease from 7-8/10 to 2/10 this date and no triggering or locking report for few wks. Pt with increase wrist and digits composite extention AROM  with less pain on volar hand and forearm- only  pain this date coming in is in the Volar PIP - DIP better than last week after review with pt  to do heat /contrast and soft tissue mobs to volar 3rd digit prior to PROM and stretches to PIP extention - otherwise cause some pain at the volar DIP/PIP flexor like the last 2 sessions- in session after heat , graston tool and soft tissue / gentle stretch pt had full extention - Tenderness over 3rd A1pulley decreased and trigger points over flexors in forearm and 3rd digit  improved - pt to focus on PIP extention and, AAROM MC flexion ,  intrinsic fist prior to composite - and not tight fist yet  -Pt to cont to keep pain under 2-3/10 - with use and AROM - and  cont to decrease pain, edema and increase AROM painfree - with increase pain and edema and decrease ROM and strength pt limited in functional use of R dominant hand and can benefit from skilled OT services    OT Occupational Profile and History Problem Focused Assessment - Including review of records relating to presenting problem    Occupational performance deficits (Please refer to evaluation for details): ADL's;IADL's;Work;Play;Leisure    Body Structure / Function / Physical Skills ADL;Decreased knowledge of precautions;Flexibility;IADL;ROM;Edema;UE  functional use;Pain;Strength    Rehab Potential Good    Clinical Decision Making Limited treatment options, no task modification necessary    Comorbidities Affecting Occupational Performance: None    Modification or Assistance to Complete Evaluation  No modification of tasks or assist necessary to complete eval    OT Frequency 1x / week    OT Duration 4 weeks    OT Treatment/Interventions Self-care/ADL training;Cryotherapy;Iontophoresis;Contrast Bath;Fluidtherapy;DME and/or AE instruction;Splinting;Therapeutic exercise;Manual Therapy;Passive range of motion    Consulted and Agree with Plan of Care Patient             Patient will benefit from skilled therapeutic intervention in order to improve the following deficits and impairments:   Body Structure / Function / Physical Skills: ADL, Decreased knowledge of precautions, Flexibility, IADL, ROM, Edema, UE functional use, Pain, Strength       Visit Diagnosis: Pain in right finger(s)  Pain in right forearm  Stiffness of right hand, not elsewhere classified  Muscle weakness (generalized)  Stiffness of right wrist, not elsewhere classified    Problem List Patient Active Problem List   Diagnosis Date Noted   Diverticulitis 07/02/2017    Rosalyn Gess, OTR/L,CLT 09/14/2021, 10:58 AM  [place] [facility] [address] [place], [place],  [place] Phone: [phone]   Fax:  [phone]  Name: Whitney Liu MRN: [id number] Date of Birth: [birth year]

## 2021-09-20 ENCOUNTER — Ambulatory Visit: Payer: Medicare PPO | Admitting: Occupational Therapy

## 2021-09-20 DIAGNOSIS — M79644 Pain in right finger(s): Secondary | ICD-10-CM

## 2021-09-20 DIAGNOSIS — M25641 Stiffness of right hand, not elsewhere classified: Secondary | ICD-10-CM

## 2021-09-20 DIAGNOSIS — M6281 Muscle weakness (generalized): Secondary | ICD-10-CM

## 2021-09-20 NOTE — Therapy (Signed)
[place] [facility] [address], [place], [address] Phone: [phone]   Fax:  [phone]  Occupational Therapy Treatment/ PN 10th visit  Patient Details  Name: Whitney Liu MRN: [id number] Date of Birth: 01-23-1946 Referring Provider (OT): DR Rudene Christians   Encounter Date: 09/20/2021   OT End of Session - 09/20/21 1906     Visit Number 10    Number of Visits 11    Date for OT Re-Evaluation 10/26/21    OT Start Time 1545    OT Stop Time 1614    OT Time Calculation (min) 29 min    Activity Tolerance Patient tolerated treatment well    Behavior During Therapy [facility] for tasks assessed/performed             Past Medical History:  Diagnosis Date   Arthritis    Basal cell carcinoma 06/17/2013   Left nasal canthus.  Mohs   Basal cell carcinoma 02/08/2020   Right post. thigh Superficial and nodular patterns. EDC.   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 Late lesion with regression.   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 WD SCC, consistent with KA. Excised 07/02/2013.    Past Surgical History:  Procedure Laterality Date   ABDOMINAL HYSTERECTOMY     APPENDECTOMY     AXILLARY LYMPH NODE BIOPSY Right    BREAST BIOPSY Right 2014   neg   CATARACT EXTRACTION W/PHACO Right 07/10/2017   Procedure: CATARACT EXTRACTION PHACO AND INTRAOCULAR LENS PLACEMENT (IOC);  Surgeon: Leandrew Koyanagi, MD;  Location: [place];  Service: Ophthalmology;  Laterality: Right;  IVA TOPICAL RIGHT   CATARACT EXTRACTION W/PHACO Left 06/10/2018   Procedure: CATARACT EXTRACTION PHACO AND INTRAOCULAR LENS PLACEMENT ([place]) LEFT IVA TOPICAL;  Surgeon: Leandrew Koyanagi, MD;  Location: [place];  Service: Ophthalmology;  Laterality: Left;   OOPHORECTOMY      There were no vitals  filed for this visit.   Subjective Assessment - 09/20/21 1822     Subjective  I done my exercises like 4 x day and work my finger - but look it is straight but the pain and tenderness stay in that middle joint to the distal one - knuckle and palm /forearm great    Pertinent History Whitney Liu is a 75 y.o. female here today.     The patient presents for evaluation of right long finger pain and difficulty with extension. She had prior x-rays that Dr. Rosalia Hammers had done. She refused an injection when he saw her. X-rays reviewed today from 06/29/2021. Right long finger shows no significant degenerative changes. No acute process.    The patient states her right long finger is bothering her due to her being right-hand dominant. 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 She has been using cocoa butter on her right long finger, but it was shooting pain all the way up at one time. She has had symptoms for more than 2 months.REfer to OT    Patient Stated Goals Want the pain and motion better in my dominant hand in the middle finger so I can use it with grip, writing , computer , eating, squeez    Currently in Pain? Yes    Pain Score 4  Pain Location Finger (Comment which one)    Pain Orientation Right                [facility] OT Assessment - 09/20/21 0001       Right Hand AROM   R Long  MCP 0-90 85 Degrees    R Long PIP 0-100 95 Degrees   0            Arrive pain 2-4/10 with composite fist and volar PIP joint more than DIP  Full extention at PIP        OT Treatments/Exercises (OP) - 09/20/21 0001       RUE Fluidotherapy   Number Minutes Fluidotherapy 10 Minutes    RUE Fluidotherapy Location Hand    Comments AROM for tendon glides to decrease stiffness an dpain            Gentle traction, massage to 3rd digit - using graston tool nr 2 over volar 4th ,brushing and over palm and volar forearm  full extnetion of PIP pain free- tenderness over  volar PIP decrease after graston and fluido  Ed pt to use heat or do soft tissue massage prior to stretches or AAROM - and cont to work volar forearm But only 2 x day - no over do HEP MC flexion  at 85  same as L hand with PIP extention -, PIP 95 Full extnetion  this session again   AROM tendon glides - block MC flexion and PIP extention  Intrinsic fist block prior to composite flexion - but don't try and touch palm  Pain free  Keep pain under 2/10   Enlarge grip and use palm or larger joints to pick up or carry objects        OT Education - 09/20/21 1905     Education Details progress and HEP    Person(s) Educated Patient    Methods Explanation;Demonstration;Tactile cues;Verbal cues;Handout    Comprehension Verbal cues required;Returned demonstration;Verbalized understanding                 OT Long Term Goals - 09/04/21 1420       OT LONG TERM GOAL #1   Title Pt to be independent in HEP to decrease pain and edema , increase AROM in 3rd digit to touch palm without increase symptoms    Baseline MC 80, PIP 85 pain increase 3-8/10 - cannot touch palm - no knowledge of HEP  NOW great progress still tenderness 2-5/10 , and flexion MC 85, PIP 95- no triggering , composite extentoin no pain    Time 3    Period Weeks    Status On-going    Target Date 09/25/21      OT LONG TERM GOAL #2   Title Pt R hand 3rd digit increase to WNL to use in light ADL's and computer with pain less than 2./10 at the worse    Baseline Decrease flexion 80 MC , PIP 85 - PIP  ext -10 - pain 8/10 increase with fisting - NOW pain much better- no triggering - tenderness 2-5/10 over volar DIP and A1pulley - flexion 85, 95, full extention in session- no pain    Time 3    Period Weeks    Status On-going    Target Date 09/25/21      OT LONG TERM GOAL #3   Title Pt to have AROM in R hand and wrist WNL without increase symptoms to return to prior level of function writing ,  computer , carry groceries     Baseline pain 8/10 increase with fisting - MC 80, PIP 85 -  NOW pain decrease 2-5/10 - progressing well    Time 3    Period Weeks    Status On-going    Target Date 09/25/21                   Plan - 09/20/21 1906     Clinical Impression Statement Pt refer with diagnosis of R 3rd digit trigger finger and  PIP flexor contracture -pt is about 4  months our from injury - pt tenderness over A1pulley decrease from 7-8/10 to 2-4/10 this date and no triggering or locking report for few wks. Pt with increase wrist and digits composite extention AROM  with less pain on volar hand and forearm- only  pain this date coming in is in the Volar PIP to DIP. Pt arrive with [place]  and  PIP 95  degrees with full extention - pt to decrease to only 2 session a day using heat or contrast and still avoid tight grip and keep pain under 2/10. Pt has no pain over A1pulley this date or ttrigger points over flexors in forearm- Had 5 sessions of ionto -  pt to focus on PIP extention and, AAROM/AROM  MC flexion ,  intrinsic fist prior to composite - and not tight fist.   -Pt to cont to keep pain under 2/10 - with use and AROM - can take time for edema to decrease in PIP and digits allow tight and composite fist and intrinsic fist - pt to return in 2 wks -  cont to decrease pain, edema and increase AROM painfree - with increase pain and edema and decrease ROM and strength pt limited in functional use of R dominant hand and can benefit from skilled OT services    OT Occupational Profile and History Problem Focused Assessment - Including review of records relating to presenting problem    Occupational performance deficits (Please refer to evaluation for details): ADL's;IADL's;Work;Play;Leisure    Body Structure / Function / Physical Skills ADL;Decreased knowledge of precautions;Flexibility;IADL;ROM;Edema;UE functional use;Pain;Strength    Rehab Potential Good    Clinical Decision Making Limited treatment options, no task  modification necessary    Comorbidities Affecting Occupational Performance: None    Modification or Assistance to Complete Evaluation  No modification of tasks or assist necessary to complete eval    OT Frequency Biweekly    OT Duration 4 weeks    OT Treatment/Interventions Self-care/ADL training;Cryotherapy;Iontophoresis;Contrast Bath;Fluidtherapy;DME and/or AE instruction;Splinting;Therapeutic exercise;Manual Therapy;Passive range of motion    Consulted and Agree with Plan of Care Patient             Patient will benefit from skilled therapeutic intervention in order to improve the following deficits and impairments:   Body Structure / Function / Physical Skills: ADL, Decreased knowledge of precautions, Flexibility, IADL, ROM, Edema, UE functional use, Pain, Strength       Visit Diagnosis: Pain in right finger(s)  Stiffness of right hand, not elsewhere classified  Muscle weakness (generalized)    Problem List Patient Active Problem List   Diagnosis Date Noted   Diverticulitis 07/02/2017    Whitney Liu, OTR/L,CLT 09/20/2021, 7:15 PM  [place] [facility] [address] [place], [address] Phone: [phone]   Fax:  [phone]  Name: Whitney Liu MRN: [id number] Date of Birth: August 15, 1946

## 2021-09-28 ENCOUNTER — Ambulatory Visit: Payer: Medicare PPO | Admitting: Occupational Therapy

## 2021-10-05 ENCOUNTER — Ambulatory Visit: Payer: Medicare PPO | Attending: Orthopedic Surgery | Admitting: Occupational Therapy

## 2021-10-05 DIAGNOSIS — M79631 Pain in right forearm: Secondary | ICD-10-CM | POA: Insufficient documentation

## 2021-10-05 DIAGNOSIS — M25631 Stiffness of right wrist, not elsewhere classified: Secondary | ICD-10-CM | POA: Diagnosis present

## 2021-10-05 DIAGNOSIS — M25641 Stiffness of right hand, not elsewhere classified: Secondary | ICD-10-CM | POA: Diagnosis present

## 2021-10-05 DIAGNOSIS — M6281 Muscle weakness (generalized): Secondary | ICD-10-CM | POA: Diagnosis present

## 2021-10-05 DIAGNOSIS — M79644 Pain in right finger(s): Secondary | ICD-10-CM | POA: Insufficient documentation

## 2021-10-05 NOTE — Therapy (Signed)
[facility] [address] [place], [address] Phone: [phone]   Fax:  [phone]  Occupational Therapy Treatment  Patient Details  Name: Whitney Liu MRN: [id number] Date of Birth: 07-10-1946 Referring Provider (OT): DR Rudene Christians   Encounter Date: 10/05/2021   OT End of Session - 10/05/21 1017     Visit Number 11    Number of Visits 12    Date for OT Re-Evaluation 10/26/21    OT Start Time 0945    OT Stop Time 1002    OT Time Calculation (min) 17 min    Activity Tolerance Patient tolerated treatment well    Behavior During Therapy [facility] for tasks assessed/performed             Past Medical History:  Diagnosis Date   Arthritis    Basal cell carcinoma 06/17/2013   Left nasal canthus.  Mohs   Basal cell carcinoma 02/08/2020   Right post. thigh Superficial and nodular patterns. EDC.   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 Late lesion with regression.   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 WD SCC, consistent with KA. Excised 07/02/2013.    Past Surgical History:  Procedure Laterality Date   ABDOMINAL HYSTERECTOMY     APPENDECTOMY     AXILLARY LYMPH NODE BIOPSY Right    BREAST BIOPSY Right 2014   neg   CATARACT EXTRACTION W/PHACO Right 07/10/2017   Procedure: CATARACT EXTRACTION PHACO AND INTRAOCULAR LENS PLACEMENT (IOC);  Surgeon: Leandrew Koyanagi, MD;  Location: [facility];  Service: Ophthalmology;  Laterality: Right;  IVA TOPICAL RIGHT   CATARACT EXTRACTION W/PHACO Left 06/10/2018   Procedure: CATARACT EXTRACTION PHACO AND INTRAOCULAR LENS PLACEMENT ([place]) LEFT IVA TOPICAL;  Surgeon: Leandrew Koyanagi, MD;  Location: [facility];  Service: Ophthalmology;  Laterality: Left;   OOPHORECTOMY      There were no vitals filed for this  visit.   Subjective Assessment - 10/05/21 1015     Subjective  I am using it most of the time in everything -but still just that tip joint wants to be tender at times- doing okay but I had bad day Tuesday morning when I got up    Pertinent History Whitney Liu is a 75 y.o. female here today.     The patient presents for evaluation of right long finger pain and difficulty with extension. She had prior x-rays that Dr. Rosalia Hammers had done. She refused an injection when he saw her. X-rays reviewed today from 06/29/2021. Right long finger shows no significant degenerative changes. No acute process.    The patient states her right long finger is bothering her due to her being right-hand dominant. 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 She has been using cocoa butter on her right long finger, but it was shooting pain all the way up at one time. She has had symptoms for more than 2 months.REfer to OT    Patient Stated Goals Want the pain and motion better in my dominant hand in the middle finger so I can use it with grip, writing , computer , eating, squeez    Currently in Pain? Yes    Pain Score 5    volar DIP tender   Pain Location  Finger (Comment which one)    Pain Orientation Right    Pain Descriptors / Indicators Aching;Tightness;Tender    Pain Type Acute pain    Pain Onset More than a month ago    Pain Frequency Intermittent                [facility] OT Assessment - 10/05/21 0001       Strength   Right Hand Lateral Pinch 12 lbs    Right Hand 3 Point Pinch 8 lbs   10 in session decrease pain   Left Hand Lateral Pinch 14 lbs    Left Hand 3 Point Pinch 10 lbs      Right Hand AROM   R Long  MCP 0-90 80 Degrees    R Long PIP 0-100 100 Degrees   0 in session                Tenderness over volar DIP of 3rd 4-5/10 coming in - decrease extnetion of PIP  massage to  volar 3rd digit - using graston tool nr 2 over volar brushing and over palm   full  extnetion of PIP  with MC flexion at 90 - 10 reps  Rolling over foam roller 20 x  Repeat extnetion of PIP with MC flexion at 90  Decrease pain and PROM of digit and palm on table - 10 x  Tendon glides inbetween  Full extnetion  this session again - with no pain in volar DIP    Ed on HEP am and then 2 more times to increase extnetion during day - she is using flexion the whole day          OT Education - 10/05/21 1017     Education Details HEP for massage and ROM 3rd digit    Person(s) Educated Patient    Methods Explanation;Demonstration;Tactile cues;Verbal cues;Handout    Comprehension Verbal cues required;Returned demonstration;Verbalized understanding                 OT Long Term Goals - 09/04/21 1420       OT LONG TERM GOAL #1   Title Pt to be independent in HEP to decrease pain and edema , increase AROM in 3rd digit to touch palm without increase symptoms    Baseline MC 80, PIP 85 pain increase 3-8/10 - cannot touch palm - no knowledge of HEP  NOW great progress still tenderness 2-5/10 , and flexion MC 85, PIP 95- no triggering , composite extentoin no pain    Time 3    Period Weeks    Status On-going    Target Date 09/25/21      OT LONG TERM GOAL #2   Title Pt R hand 3rd digit increase to WNL to use in light ADL's and computer with pain less than 2./10 at the worse    Baseline Decrease flexion 80 MC , PIP 85 - PIP  ext -10 - pain 8/10 increase with fisting - NOW pain much better- no triggering - tenderness 2-5/10 over volar DIP and A1pulley - flexion 85, 95, full extention in session- no pain    Time 3    Period Weeks    Status On-going    Target Date 09/25/21      OT LONG TERM GOAL #3   Title Pt to have AROM in R hand and wrist WNL without increase symptoms to return to prior level of function writing , computer , carry groceries    Baseline pain  8/10 increase with fisting - MC 80, PIP 85 -  NOW pain decrease 2-5/10 - progressing well    Time 3    Period  Weeks    Status On-going    Target Date 09/25/21                   Plan - 10/05/21 1017     Clinical Impression Statement Pt refer with diagnosis of R 3rd digit trigger finger and  PIP flexor contracture -pt is about 4 1/2  months our from injury - pt tenderness over A1pulley decrease from 7-8/10 to 2/10 this date and no triggering or locking report for few wks. Pt with increase wrist and digits composite extention AROM  with less pain on volar hand and forearm- only  pain this date coming in is in the Volar DIP. Pt arrive with [place]  and  PIP 100 degrees- and extention decrease at PIP. Pt report using hand  normally but at times still pain in the DIP or pain - reinforce with pt again that flexion is 50 x stronger and using it the whole day - in session with only soft tissue and PROM for PIP and DIP extention - pt had full extention while maintaining her flexion and pain decrease at volar DIP to 0-1/10 - Pt to do heat ,massage and lumbrical flexion wtih PIP extention stretch and then extention on table in the am and repeat few times during day until she can maintain her extention - pt to follow up in month -  cont to decrease pain, pain free AROM and focus on extention of 3rd digit - cont with  skilled OT services    OT Occupational Profile and History Problem Focused Assessment - Including review of records relating to presenting problem    Occupational performance deficits (Please refer to evaluation for details): ADL's;IADL's;Work;Play;Leisure    Body Structure / Function / Physical Skills ADL;Decreased knowledge of precautions;Flexibility;IADL;ROM;Edema;UE functional use;Pain;Strength    Rehab Potential Good    Clinical Decision Making Limited treatment options, no task modification necessary    Comorbidities Affecting Occupational Performance: None    Modification or Assistance to Complete Evaluation  No modification of tasks or assist necessary to complete eval    OT Frequency Monthly     OT Duration 4 weeks    OT Treatment/Interventions Self-care/ADL training;Cryotherapy;Iontophoresis;Contrast Bath;Fluidtherapy;DME and/or AE instruction;Splinting;Therapeutic exercise;Manual Therapy;Passive range of motion    Consulted and Agree with Plan of Care Patient             Patient will benefit from skilled therapeutic intervention in order to improve the following deficits and impairments:   Body Structure / Function / Physical Skills: ADL, Decreased knowledge of precautions, Flexibility, IADL, ROM, Edema, UE functional use, Pain, Strength       Visit Diagnosis: Pain in right finger(s)  Stiffness of right hand, not elsewhere classified  Muscle weakness (generalized)  Pain in right forearm  Stiffness of right wrist, not elsewhere classified    Problem List Patient Active Problem List   Diagnosis Date Noted   Diverticulitis 07/02/2017    Rosalyn Gess, OTR/L,CLT 10/05/2021, 10:24 AM  [facility] [address], [place], [address] Phone: [phone]   Fax:  [phone]  Name: Whitney Liu MRN: [id number] Date of Birth: [birth year]

## 2021-11-02 ENCOUNTER — Ambulatory Visit: Payer: Medicare PPO | Attending: Orthopedic Surgery | Admitting: Occupational Therapy

## 2021-11-02 DIAGNOSIS — M79644 Pain in right finger(s): Secondary | ICD-10-CM | POA: Insufficient documentation

## 2021-11-02 DIAGNOSIS — M25641 Stiffness of right hand, not elsewhere classified: Secondary | ICD-10-CM | POA: Insufficient documentation

## 2021-11-09 ENCOUNTER — Ambulatory Visit: Payer: Medicare PPO | Admitting: Occupational Therapy

## 2021-11-09 DIAGNOSIS — M25641 Stiffness of right hand, not elsewhere classified: Secondary | ICD-10-CM | POA: Diagnosis present

## 2021-11-09 DIAGNOSIS — M79644 Pain in right finger(s): Secondary | ICD-10-CM | POA: Diagnosis present

## 2021-11-09 NOTE — Therapy (Signed)
[facility] [address], [place], [address] Phone: [phone]   Fax:  [phone]  Occupational Therapy Treatment  Patient Details  Name: Whitney Liu MRN: [id number] Date of Birth: [birth year] Referring Provider (OT): DR Rudene Christians   Encounter Date: 11/09/2021   OT End of Session - 11/09/21 1347     Visit Number 12    Number of Visits 12    Date for OT Re-Evaluation 11/09/21    OT Start Time 1315    OT Stop Time 1333    OT Time Calculation (min) 18 min    Activity Tolerance Patient tolerated treatment well    Behavior During Therapy [facility] for tasks assessed/performed             Past Medical History:  Diagnosis Date   Arthritis    Basal cell carcinoma 06/17/2013   Left nasal canthus.  Mohs   Basal cell carcinoma 02/08/2020   Right post. thigh Superficial and nodular patterns. EDC.   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 Late lesion with regression.   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 WD SCC, consistent with KA. Excised 07/02/2013.    Past Surgical History:  Procedure Laterality Date   ABDOMINAL HYSTERECTOMY     APPENDECTOMY     AXILLARY LYMPH NODE BIOPSY Right    BREAST BIOPSY Right 2014   neg   CATARACT EXTRACTION W/PHACO Right 07/10/2017   Procedure: CATARACT EXTRACTION PHACO AND INTRAOCULAR LENS PLACEMENT (IOC);  Surgeon: Leandrew Koyanagi, MD;  Location: [facility];  Service: Ophthalmology;  Laterality: Right;  IVA TOPICAL RIGHT   CATARACT EXTRACTION W/PHACO Left 06/10/2018   Procedure: CATARACT EXTRACTION PHACO AND INTRAOCULAR LENS PLACEMENT ([place]) LEFT IVA TOPICAL;  Surgeon: Leandrew Koyanagi, MD;  Location: [facility];  Service: Ophthalmology;  Laterality: Left;   OOPHORECTOMY      There were no vitals filed for this  visit.   Subjective Assessment - 11/09/21 1346     Subjective  My finger been good -but then at times I see it stays bend- and tip little sore at times- but I sleep in fist but using it normally    Pertinent History Whitney Liu is a 75 y.o. female here today.     The patient presents for evaluation of right long finger pain and difficulty with extension. She had prior x-rays that Dr. Rosalia Hammers had done. She refused an injection when he saw her. X-rays reviewed today from 06/29/2021. Right long finger shows no significant degenerative changes. No acute process.    The patient states her right long finger is bothering her due to her being right-hand dominant. 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 She has been using cocoa butter on her right long finger, but it was shooting pain all the way up at one time. She has had symptoms for more than 2 months.REfer to OT    Patient Stated Goals Want the pain and motion better in my dominant hand in the middle finger so I can use it with grip, writing , computer , eating, squeez    Currently in Pain? Yes    Pain Score 1     Pain Location Finger (Comment which one)    Pain Orientation Right  Pain Descriptors / Indicators Tender    Pain Type Chronic pain    Pain Onset More than a month ago                       Tenderness over volar DIP of 3rd 2/10 coming in - decrease extention of PIP  massage to  volar 3rd digit - using graston tool nr 2 over volar brushing and over palm   PROM  extnetion of PIP  with MC flexion at 90 - 10 reps   Repeat extention of PIP with MC flexion at 90  Decrease pain and PROM of digit and palm on table - 10 x   Full extnetion  this session again - with no pain in volar DIP    Ed on HEP- flexion 50 x stronger - using it and sleeping in fist  To do 2-3 x during day massage , PROM MC 90 and PIP extention, and then on table PROM PIP extention  Contact me if need to follow up                 OT Education - 11/09/21 1347     Education Details HEP for massage and ROM 3rd digit    Person(s) Educated Patient    Methods Explanation;Demonstration;Tactile cues;Verbal cues;Handout    Comprehension Verbal cues required;Returned demonstration;Verbalized understanding                 OT Long Term Goals - 11/09/21 1352       OT LONG TERM GOAL #1   Title Pt to be independent in HEP to decrease pain and edema , increase AROM in 3rd digit to touch palm without increase symptoms    Status Achieved      OT LONG TERM GOAL #2   Title Pt R hand 3rd digit increase to WNL to use in light ADL's and computer with pain less than 2./10 at the worse    Status Achieved      OT LONG TERM GOAL #3   Title Pt to have AROM in R hand and wrist WNL without increase symptoms to return to prior level of function writing , computer , carry groceries    Status Achieved                   Plan - 11/09/21 1348     Clinical Impression Statement Pt refer with diagnosis of R 3rd digit trigger finger and  PIP flexor contracture -pt is about 5 1/2  months our from injury - pt tenderness over A1pulley decrease from 7-8/10 to 0/10 this date and no triggering or locking report for about 2 months.. Pt with increase wrist and digits composite extention AROM  with less pain on volar hand and forearm- only  pain this date coming in is in the Volar DIP. Pt arrive with 55 MC  and  PIP 90 degrees- and extention decrease at PIP -10. Pt report using hand  normally during day but sleeping in fist- and then she get bend in the finger during day. Reinforce with pt again that flexion is 50 x stronger and using it the whole day AND sleeping in fist - finger is going to flex easier - in session with only soft tissue and PROM for PIP and DIP extention - pt had full extention and less pain at  DIP. Pt to do heat ,massage and MC flexion wtih PIP extention stretch and then extention on table  2-3 x  during day to maintain her extention. Pt to cont at home and contact me if needed    OT Occupational Profile and History Problem Focused Assessment - Including review of records relating to presenting problem    Occupational performance deficits (Please refer to evaluation for details): ADL's;IADL's;Work;Play;Leisure    Body Structure / Function / Physical Skills ADL;Decreased knowledge of precautions;Flexibility;IADL;ROM;Edema;UE functional use;Pain;Strength    Rehab Potential Good    Clinical Decision Making Limited treatment options, no task modification necessary    Comorbidities Affecting Occupational Performance: None    Modification or Assistance to Complete Evaluation  No modification of tasks or assist necessary to complete eval    OT Treatment/Interventions Self-care/ADL training;Cryotherapy;Iontophoresis;Contrast Bath;Fluidtherapy;DME and/or AE instruction;Splinting;Therapeutic exercise;Manual Therapy;Passive range of motion    Consulted and Agree with Plan of Care Patient             Patient will benefit from skilled therapeutic intervention in order to improve the following deficits and impairments:   Body Structure / Function / Physical Skills: ADL, Decreased knowledge of precautions, Flexibility, IADL, ROM, Edema, UE functional use, Pain, Strength       Visit Diagnosis: Pain in right finger(s) - Plan: Ot plan of care cert/re-cert  Stiffness of right hand, not elsewhere classified - Plan: Ot plan of care cert/re-cert    Problem List Patient Active Problem List   Diagnosis Date Noted   Diverticulitis 07/02/2017    Rosalyn Gess, OTR/L,CLT 11/09/2021, 1:55 PM  [facility] [address] [place], [address] Phone: [phone]   Fax:  [phone]  Name: Whitney Liu [place] MRN: [id number] Date of Birth: 07-19-1946

## 2022-04-23 ENCOUNTER — Ambulatory Visit: Payer: Medicare PPO | Admitting: Dermatology

## 2022-04-23 DIAGNOSIS — L570 Actinic keratosis: Secondary | ICD-10-CM | POA: Diagnosis not present

## 2022-04-23 DIAGNOSIS — Z85828 Personal history of other malignant neoplasm of skin: Secondary | ICD-10-CM | POA: Diagnosis not present

## 2022-04-23 DIAGNOSIS — L578 Other skin changes due to chronic exposure to nonionizing radiation: Secondary | ICD-10-CM

## 2022-04-23 NOTE — Progress Notes (Signed)
   Follow-Up Visit   Subjective  Whitney Liu is a 76 y.o. female who presents for the following: Skin Problem (Patient here today for a spot at left upper thigh, present for about 3 months. Patient also has smaller, similar spots at right hand, left hand and right knee. ).   The following portions of the chart were reviewed this encounter and updated as appropriate:       Review of Systems:  No other skin or systemic complaints except as noted in HPI or Assessment and Plan.  Objective  Well appearing patient in no apparent distress; mood and affect are within normal limits.  A focused examination was performed including legs, hands. Relevant physical exam findings are noted in the Assessment and Plan.  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 thigh, right hand dorsum, left hand dorsum, right medial knee  Hypertrophic   Recheck left lateral anterior thigh  Patient will RTC if not resolved at 6 weeks, otherwise return as scheduled  Destruction of lesion - left lateral ant thigh, right hand dorsum, left hand dorsum, right medial knee  Destruction method: cryotherapy   Informed consent: discussed and consent obtained   Lesion destroyed using liquid nitrogen: Yes   Region frozen until ice ball extended beyond lesion: Yes   Outcome: patient tolerated procedure well with no complications   Post-procedure details: wound care instructions given   Additional details:  Prior to procedure, discussed risks of blister formation, small wound, skin dyspigmentation, or rare scar following cryotherapy. Recommend Vaseline ointment to treated areas while healing.   Actinic Damage - chronic, secondary to cumulative UV radiation exposure/sun exposure over time - diffuse scaly erythematous macules with underlying dyspigmentation - Recommend daily broad  spectrum sunscreen SPF 30+ to sun-exposed areas, reapply every 2 hours as needed.  - Recommend staying in the shade or wearing long sleeves, sun glasses (UVA+UVB protection) and wide brim hats (4-inch brim around the entire circumference of the hat). - Call for new or changing lesions.   History of Basal Cell Carcinoma of the Skin - No evidence of recurrence today - Recommend regular full body skin exams - Recommend daily broad spectrum sunscreen SPF 30+ to sun-exposed areas, reapply every 2 hours as needed.  - Call if any new or changing lesions are noted between office visits   Return for as scheduled.  Graciella Belton, RMA, am acting as scribe for Brendolyn Patty, MD .  Documentation: I have reviewed the above documentation for accuracy and completeness, and I agree with the above.  Brendolyn Patty MD

## 2022-04-23 NOTE — Patient Instructions (Addendum)

## 2022-08-27 ENCOUNTER — Ambulatory Visit: Payer: Medicare PPO | Admitting: Dermatology

## 2022-09-03 ENCOUNTER — Other Ambulatory Visit: Payer: Self-pay | Admitting: Family Medicine

## 2022-09-03 DIAGNOSIS — Z1231 Encounter for screening mammogram for malignant neoplasm of breast: Secondary | ICD-10-CM

## 2022-10-04 ENCOUNTER — Ambulatory Visit
Admission: RE | Admit: 2022-10-04 | Discharge: 2022-10-04 | Disposition: A | Discharge status: Home / Self Care | Payer: Medicare PPO | Source: Ambulatory Visit | Attending: Family Medicine | Admitting: Family Medicine

## 2022-10-04 DIAGNOSIS — Z1231 Encounter for screening mammogram for malignant neoplasm of breast: Secondary | ICD-10-CM | POA: Diagnosis present

## 2022-10-15 ENCOUNTER — Ambulatory Visit: Payer: Medicare PPO | Admitting: Dermatology

## 2022-10-15 DIAGNOSIS — L570 Actinic keratosis: Secondary | ICD-10-CM

## 2022-10-15 DIAGNOSIS — Z85828 Personal history of other malignant neoplasm of skin: Secondary | ICD-10-CM | POA: Diagnosis not present

## 2022-10-15 DIAGNOSIS — L820 Inflamed seborrheic keratosis: Secondary | ICD-10-CM | POA: Diagnosis not present

## 2022-10-15 DIAGNOSIS — Z1283 Encounter for screening for malignant neoplasm of skin: Secondary | ICD-10-CM

## 2022-10-15 DIAGNOSIS — L578 Other skin changes due to chronic exposure to nonionizing radiation: Secondary | ICD-10-CM | POA: Diagnosis not present

## 2022-10-15 DIAGNOSIS — L821 Other seborrheic keratosis: Secondary | ICD-10-CM

## 2022-10-15 DIAGNOSIS — D225 Melanocytic nevi of trunk: Secondary | ICD-10-CM

## 2022-10-15 DIAGNOSIS — D229 Melanocytic nevi, unspecified: Secondary | ICD-10-CM

## 2022-10-15 DIAGNOSIS — L814 Other melanin hyperpigmentation: Secondary | ICD-10-CM

## 2022-10-15 NOTE — Progress Notes (Signed)
Follow-Up Visit   Subjective  Whitney Liu is a 76 y.o. female who presents for the following: Total body skin exam (Hx of BCC, SCC, AKs) and check spot (L ant thigh, just noticed/Nose, comes and goes).  Has itchy spots on back of neck.  The patient presents for Total-Body Skin Exam (TBSE) for skin cancer screening and mole check.  The patient has spots, moles and lesions to be evaluated, some may be new or changing and the patient has concerns that these could be cancer.   The following portions of the chart were reviewed this encounter and updated as appropriate:       Review of Systems:  No other skin or systemic complaints except as noted in HPI or Assessment and Plan.  Objective  Well appearing patient in no apparent distress; mood and affect are within normal limits.  A full examination was performed including scalp, head, eyes, ears, nose, lips, neck, chest, axillae, abdomen, back, buttocks, bilateral upper extremities, bilateral lower extremities, hands, feet, fingers, toes, fingernails, and toenails. All findings within normal limits unless otherwise noted below.  L ant flank; Left Lower Back  L ant flank 7.0 x 4.73m brown speckled macule  Left Lower Back 5.0 x 3.042mmed dark brown macule  R nasal dorsum x 1, L ant thigh x 1, L upper knee x 1, L medial lower leg x 1, L popliteal x 1 (5) Pink scaly, keratotic macules  Posterior neck x 3 (3) Stuck on waxy paps with erythema    Assessment & Plan   Lentigines - Scattered tan macules - Due to sun exposure - Benign-appearing, observe - Recommend daily broad spectrum sunscreen SPF 30+ to sun-exposed areas, reapply every 2 hours as needed. - Call for any changes - face, back  Seborrheic Keratoses - Stuck-on, waxy, tan-brown papules and/or plaques  - Benign-appearing - Discussed benign etiology and prognosis. - Observe - Call for any changes - back  Actinic Damage - Chronic condition, secondary to cumulative  UV/sun exposure - diffuse scaly erythematous macules with underlying dyspigmentation - Recommend daily broad spectrum sunscreen SPF 30+ to sun-exposed areas, reapply every 2 hours as needed.  - Staying in the shade or wearing long sleeves, sun glasses (UVA+UVB protection) and wide brim hats (4-inch brim around the entire circumference of the hat) are also recommended for sun protection.  - Call for new or changing lesions. - face, chest  Skin cancer screening performed today.   History of Basal Cell Carcinoma of the Skin - No evidence of recurrence today - Recommend regular full body skin exams - Recommend daily broad spectrum sunscreen SPF 30+ to sun-exposed areas, reapply every 2 hours as needed.  - Call if any new or changing lesions are noted between office visits  - L nasal canthus, R post thigh  History of Squamous Cell Carcinoma of the Skin - No evidence of recurrence today - Recommend regular full body skin exams - Recommend daily broad spectrum sunscreen SPF 30+ to sun-exposed areas, reapply every 2 hours as needed.  - Call if any new or changing lesions are noted between office visits - R thigh post, L upper thigh post  Nevus (2) L ant flank; Left Lower Back  Benign-appearing. Stable compared to previous visit. Observation.  Call clinic for new or changing moles.  Recommend daily use of broad spectrum spf 30+ sunscreen to sun-exposed areas.    AK (actinic keratosis) (5) R nasal dorsum x 1, L ant thigh x 1,  L upper knee x 1, L medial lower leg x 1, L popliteal x 1  Destruction of lesion - R nasal dorsum x 1, L ant thigh x 1, L upper knee x 1, L medial lower leg x 1, L popliteal x 1  Destruction method: cryotherapy   Informed consent: discussed and consent obtained   Lesion destroyed using liquid nitrogen: Yes   Region frozen until ice ball extended beyond lesion: Yes   Outcome: patient tolerated procedure well with no complications   Post-procedure details: wound care  instructions given   Additional details:  Prior to procedure, discussed risks of blister formation, small wound, skin dyspigmentation, or rare scar following cryotherapy. Recommend Vaseline ointment to treated areas while healing.   Inflamed seborrheic keratosis (3) Posterior neck x 3  Symptomatic, irritating, patient would like treated.   Destruction of lesion - Posterior neck x 3  Destruction method: cryotherapy   Informed consent: discussed and consent obtained   Lesion destroyed using liquid nitrogen: Yes   Region frozen until ice ball extended beyond lesion: Yes   Outcome: patient tolerated procedure well with no complications   Post-procedure details: wound care instructions given   Additional details:  Prior to procedure, discussed risks of blister formation, small wound, skin dyspigmentation, or rare scar following cryotherapy. Recommend Vaseline ointment to treated areas while healing.    Return in about 6 months (around 04/15/2023) for TBSE, Hx of BCC, Hx of SCC, Hx of AKs.  I, Othelia Pulling, RMA, am acting as scribe for Brendolyn Patty, MD .  Documentation: I have reviewed the above documentation for accuracy and completeness, and I agree with the above.  Brendolyn Patty MD

## 2022-10-15 NOTE — Patient Instructions (Signed)
Cryotherapy Aftercare  Wash gently with soap and water everyday.   Apply Vaseline and Band-Aid daily until healed.     Due to recent changes in healthcare laws, you may see results of your pathology and/or laboratory studies on MyChart before the doctors have had a chance to review them. We understand that in some cases there may be results that are confusing or concerning to you. Please understand that not all results are received at the same time and often the doctors may need to interpret multiple results in order to provide you with the best plan of care or course of treatment. Therefore, we ask that you please give us 2 business days to thoroughly review all your results before contacting the office for clarification. Should we see a critical lab result, you will be contacted sooner.   If You Need Anything After Your Visit  If you have any questions or concerns for your doctor, please call our main line at [phone] and press option 4 to reach your doctor's medical assistant. If no one answers, please leave a voicemail as directed and we will return your call as soon as possible. Messages left after 4 pm will be answered the following business day.   You may also send us a message via MyChart. We typically respond to MyChart messages within 1-2 business days.  For prescription refills, please ask your pharmacy to contact our office. Our fax number is [phone].  If you have an urgent issue when the clinic is closed that cannot wait until the next business day, you can page your doctor at the number below.    Please note that while we do our best to be available for urgent issues outside of office hours, we are not available 24/7.   If you have an urgent issue and are unable to reach us, you may choose to seek medical care at your doctor's office, retail clinic, urgent care center, or emergency room.  If you have a medical emergency, please immediately call 911 or go to the  emergency department.  Pager Numbers  - Dr. Kowalski: [phone]  - Dr. Moye: [phone]  - Dr. Stewart: [phone]  In the event of inclement weather, please call our main line at [phone] for an update on the status of any delays or closures.  Dermatology Medication Tips: Please keep the boxes that topical medications come in in order to help keep track of the instructions about where and how to use these. Pharmacies typically print the medication instructions only on the boxes and not directly on the medication tubes.   If your medication is too expensive, please contact our office at [phone] option 4 or send us a message through MyChart.   We are unable to tell what your co-pay for medications will be in advance as this is different depending on your insurance coverage. However, we may be able to find a substitute medication at lower cost or fill out paperwork to get insurance to cover a needed medication.   If a prior authorization is required to get your medication covered by your insurance company, please allow us 1-2 business days to complete this process.  Drug prices often vary depending on where the prescription is filled and some pharmacies may offer cheaper prices.  The website www.goodrx.com contains coupons for medications through different pharmacies. The prices here do not account for what the cost may be with help from insurance (it may be cheaper with your insurance), but the website can   give you the price if you did not use any insurance.  - You can print the associated coupon and take it with your prescription to the pharmacy.  - You may also stop by our office during regular business hours and pick up a GoodRx coupon card.  - If you need your prescription sent electronically to a different pharmacy, notify our office through [facility] MyChart or by phone at [phone] option 4.     Si Usted Necesita Algo Despus de Su Visita  Tambin puede  enviarnos un mensaje a travs de MyChart. Por lo general respondemos a los mensajes de MyChart en el transcurso de 1 a 2 das hbiles.  Para renovar recetas, por favor pida a su farmacia que se ponga en contacto con nuestra oficina. Nuestro nmero de fax es el [phone].  Si tiene un asunto urgente cuando la clnica est cerrada y que no puede esperar hasta el siguiente da hbil, puede llamar/localizar a su doctor(a) al nmero que aparece a continuacin.   Por favor, tenga en cuenta que aunque hacemos todo lo posible para estar disponibles para asuntos urgentes fuera del horario de oficina, no estamos disponibles las 24 horas del da, los 7 das de la semana.   Si tiene un problema urgente y no puede comunicarse con nosotros, puede optar por buscar atencin mdica  en el consultorio de su doctor(a), en una clnica privada, en un centro de atencin urgente o en una sala de emergencias.  Si tiene una emergencia mdica, por favor llame inmediatamente al 911 o vaya a la sala de emergencias.  Nmeros de bper  - Dr. Kowalski: [phone]  - Dra. Moye: [phone]  - Dra. Stewart: [phone]  En caso de inclemencias del tiempo, por favor llame a nuestra lnea principal al [phone] para una actualizacin sobre el estado de cualquier retraso o cierre.  Consejos para la medicacin en dermatologa: Por favor, guarde las cajas en las que vienen los medicamentos de uso tpico para ayudarle a seguir las instrucciones sobre dnde y cmo usarlos. Las farmacias generalmente imprimen las instrucciones del medicamento slo en las cajas y no directamente en los tubos del medicamento.   Si su medicamento es muy caro, por favor, pngase en contacto con nuestra oficina llamando al [phone] y presione la opcin 4 o envenos un mensaje a travs de MyChart.   No podemos decirle cul ser su copago por los medicamentos por adelantado ya que esto es diferente dependiendo de la cobertura de su seguro.  Sin embargo, es posible que podamos encontrar un medicamento sustituto a menor costo o llenar un formulario para que el seguro cubra el medicamento que se considera necesario.   Si se requiere una autorizacin previa para que su compaa de seguros cubra su medicamento, por favor permtanos de 1 a 2 das hbiles para completar este proceso.  Los precios de los medicamentos varan con frecuencia dependiendo del lugar de dnde se surte la receta y alguna farmacias pueden ofrecer precios ms baratos.  El sitio web www.goodrx.com tiene cupones para medicamentos de diferentes farmacias. Los precios aqu no tienen en cuenta lo que podra costar con la ayuda del seguro (puede ser ms barato con su seguro), pero el sitio web puede darle el precio si no utiliz ningn seguro.  - Puede imprimir el cupn correspondiente y llevarlo con su receta a la farmacia.  - Tambin puede pasar por nuestra oficina durante el horario de atencin regular y recoger una tarjeta de cupones de GoodRx.  -   Si necesita que su receta se enve electrnicamente a una farmacia diferente, informe a nuestra oficina a travs de MyChart de [place] o por telfono llamando al [phone] y presione la opcin 4.  

## 2022-11-12 ENCOUNTER — Ambulatory Visit
Admission: RE | Admit: 2022-11-12 | Discharge: 2022-11-12 | Disposition: A | Discharge status: Home / Self Care | Payer: Medicare PPO | Source: Ambulatory Visit | Attending: Physician Assistant | Admitting: Physician Assistant

## 2022-11-12 ENCOUNTER — Other Ambulatory Visit (HOSPITAL_COMMUNITY): Payer: Self-pay | Admitting: Physician Assistant

## 2022-11-12 DIAGNOSIS — R1032 Left lower quadrant pain: Secondary | ICD-10-CM | POA: Insufficient documentation

## 2022-11-12 MED ORDER — IOHEXOL 300 MG/ML  SOLN
100.0000 mL | Freq: Once | INTRAMUSCULAR | Status: AC | PRN
  Administered 2022-11-12: 100 mL via INTRAVENOUS

## 2023-04-23 ENCOUNTER — Ambulatory Visit: Payer: Medicare PPO | Admitting: Dermatology

## 2023-04-23 ENCOUNTER — Encounter: Payer: Self-pay | Admitting: Dermatology

## 2023-04-23 VITALS — BP 129/75 | HR 80

## 2023-04-23 DIAGNOSIS — L820 Inflamed seborrheic keratosis: Secondary | ICD-10-CM

## 2023-04-23 DIAGNOSIS — L814 Other melanin hyperpigmentation: Secondary | ICD-10-CM

## 2023-04-23 DIAGNOSIS — S91002A Unspecified open wound, left ankle, initial encounter: Secondary | ICD-10-CM

## 2023-04-23 DIAGNOSIS — Z1283 Encounter for screening for malignant neoplasm of skin: Secondary | ICD-10-CM | POA: Diagnosis not present

## 2023-04-23 DIAGNOSIS — L578 Other skin changes due to chronic exposure to nonionizing radiation: Secondary | ICD-10-CM

## 2023-04-23 DIAGNOSIS — L821 Other seborrheic keratosis: Secondary | ICD-10-CM

## 2023-04-23 DIAGNOSIS — Z872 Personal history of diseases of the skin and subcutaneous tissue: Secondary | ICD-10-CM

## 2023-04-23 DIAGNOSIS — Z85828 Personal history of other malignant neoplasm of skin: Secondary | ICD-10-CM

## 2023-04-23 DIAGNOSIS — D225 Melanocytic nevi of trunk: Secondary | ICD-10-CM

## 2023-04-23 DIAGNOSIS — L570 Actinic keratosis: Secondary | ICD-10-CM

## 2023-04-23 DIAGNOSIS — W908XXA Exposure to other nonionizing radiation, initial encounter: Secondary | ICD-10-CM

## 2023-04-23 DIAGNOSIS — T148XXA Other injury of unspecified body region, initial encounter: Secondary | ICD-10-CM

## 2023-04-23 DIAGNOSIS — X32XXXA Exposure to sunlight, initial encounter: Secondary | ICD-10-CM

## 2023-04-23 DIAGNOSIS — D229 Melanocytic nevi, unspecified: Secondary | ICD-10-CM

## 2023-04-23 MED ORDER — DOXYCYCLINE MONOHYDRATE 100 MG PO TABS: 100.0000 mg | ORAL_TABLET | Freq: Two times a day (BID) | ORAL | 0 refills | Status: AC

## 2023-04-23 MED ORDER — MUPIROCIN 2 % EX OINT: 1.0000 | TOPICAL_OINTMENT | Freq: Two times a day (BID) | CUTANEOUS | 0 refills | Status: DC

## 2023-04-23 NOTE — Patient Instructions (Addendum)
For wound at left ankle  Start doxycycline 100 mg tab - take 1 tab by mouth twice daily for 2 weeks with food and drink  Doxycycline should be taken with food to prevent nausea. Do not lay down for 30 minutes after taking. Be cautious with sun exposure and use good sun protection while on this medication. Pregnant women should not take this medication.   Start mupirocin 2 % ointment - apply topically to affected area twice daily and cover with bandage.       Seborrheic Keratosis  What causes seborrheic keratoses? Seborrheic keratoses are harmless, common skin growths that first appear during adult life.  As time goes by, more growths appear.  Some people may develop a large number of them.  Seborrheic keratoses appear on both covered and uncovered body parts.  They are not caused by sunlight.  The tendency to develop seborrheic keratoses can be inherited.  They vary in color from skin-colored to gray, brown, or even black.  They can be either smooth or have a rough, warty surface.   Seborrheic keratoses are superficial and look as if they were stuck on the skin.  Under the microscope this type of keratosis looks like layers upon layers of skin.  That is why at times the top layer may seem to fall off, but the rest of the growth remains and re-grows.    Treatment Seborrheic keratoses do not need to be treated, but can easily be removed in the office.  Seborrheic keratoses often cause symptoms when they rub on clothing or jewelry.  Lesions can be in the way of shaving.  If they become inflamed, they can cause itching, soreness, or burning.  Removal of a seborrheic keratosis can be accomplished by freezing, burning, or surgery. If any spot bleeds, scabs, or grows rapidly, please return to have it checked, as these can be an indication of a skin cancer.    Actinic keratoses are precancerous spots that appear secondary to cumulative UV radiation exposure/sun exposure over time. They are chronic  with expected duration over 1 year. A portion of actinic keratoses will progress to squamous cell carcinoma of the skin. It is not possible to reliably predict which spots will progress to skin cancer and so treatment is recommended to prevent development of skin cancer.  Recommend daily broad spectrum sunscreen SPF 30+ to sun-exposed areas, reapply every 2 hours as needed.  Recommend staying in the shade or wearing long sleeves, sun glasses (UVA+UVB protection) and wide brim hats (4-inch brim around the entire circumference of the hat). Call for new or changing lesions.   Cryotherapy Aftercare  Wash gently with soap and water everyday.   Apply Vaseline and Band-Aid daily until healed.       Melanoma ABCDEs  Melanoma is the most dangerous type of skin cancer, and is the leading cause of death from skin disease.  You are more likely to develop melanoma if you: Have light-colored skin, light-colored eyes, or red or blond hair Spend a lot of time in the sun Tan regularly, either outdoors or in a tanning bed Have had blistering sunburns, especially during childhood Have a close family member who has had a melanoma Have atypical moles or large birthmarks  Early detection of melanoma is key since treatment is typically straightforward and cure rates are extremely high if we catch it early.   The first sign of melanoma is often a change in a mole or a new dark spot.  The ABCDE  system is a way of remembering the signs of melanoma.  A for asymmetry:  The two halves do not match. B for border:  The edges of the growth are irregular. C for color:  A mixture of colors are present instead of an even brown color. D for diameter:  Melanomas are usually (but not always) greater than 6mm - the size of a pencil eraser. E for evolution:  The spot keeps changing in size, shape, and color.  Please check your skin once per month between visits. You can use a small mirror in front and a large mirror  behind you to keep an eye on the back side or your body.   If you see any new or changing lesions before your next follow-up, please call to schedule a visit.  Please continue daily skin protection including broad spectrum sunscreen SPF 30+ to sun-exposed areas, reapplying every 2 hours as needed when you're outdoors.   Staying in the shade or wearing long sleeves, sun glasses (UVA+UVB protection) and wide brim hats (4-inch brim around the entire circumference of the hat) are also recommended for sun protection.       Due to recent changes in healthcare laws, you may see results of your pathology and/or laboratory studies on MyChart before the doctors have had a chance to review them. We understand that in some cases there may be results that are confusing or concerning to you. Please understand that not all results are received at the same time and often the doctors may need to interpret multiple results in order to provide you with the best plan of care or course of treatment. Therefore, we ask that you please give [place] 2 business days to thoroughly review all your results before contacting the office for clarification. Should we see a critical lab result, you will be contacted sooner.   If You Need Anything After Your Visit  If you have any questions or concerns for your doctor, please call our main line at [phone] and press option 4 to reach your doctor's medical assistant. If no one answers, please leave a voicemail as directed and we will return your call as soon as possible. Messages left after 4 pm will be answered the following business day.   You may also send [place] a message via MyChart. We typically respond to MyChart messages within 1-2 business days.  For prescription refills, please ask your pharmacy to contact our office. Our fax number is [phone].  If you have an urgent issue when the clinic is closed that cannot wait until the next business day, you can page your doctor  at the number below.    Please note that while we do our best to be available for urgent issues outside of office hours, we are not available 24/7.   If you have an urgent issue and are unable to reach [place], you may choose to seek medical care at your doctor's office, retail clinic, urgent care center, or emergency room.  If you have a medical emergency, please immediately call 911 or go to the emergency department.  Pager Numbers  - Dr. Gwen Pounds: [phone]  - Dr. Neale Burly: [phone]  - Dr. Roseanne Reno: [phone]  In the event of inclement weather, please call our main line at [phone] for an update on the status of any delays or closures.  Dermatology Medication Tips: Please keep the boxes that topical medications come in in order to help keep track of the instructions about where and how  to use these. Pharmacies typically print the medication instructions only on the boxes and not directly on the medication tubes.   If your medication is too expensive, please contact our office at [phone] option 4 or send [place] a message through MyChart.   We are unable to tell what your co-pay for medications will be in advance as this is different depending on your insurance coverage. However, we may be able to find a substitute medication at lower cost or fill out paperwork to get insurance to cover a needed medication.   If a prior authorization is required to get your medication covered by your insurance company, please allow [place] 1-2 business days to complete this process.  Drug prices often vary depending on where the prescription is filled and some pharmacies may offer cheaper prices.  The website www.goodrx.com contains coupons for medications through different pharmacies. The prices here do not account for what the cost may be with help from insurance (it may be cheaper with your insurance), but the website can give you the price if you did not use any insurance.  - You can print the  associated coupon and take it with your prescription to the pharmacy.  - You may also stop by our office during regular business hours and pick up a GoodRx coupon card.  - If you need your prescription sent electronically to a different pharmacy, notify our office through [facility] or by phone at [phone] option 4.     Si Usted Necesita Algo Despus de Su Visita  Tambin puede enviarnos un mensaje a travs de Clinical cytogeneticist. Por lo general respondemos a los mensajes de MyChart en el transcurso de 1 a 2 das hbiles.  Para renovar recetas, por favor pida a su farmacia que se ponga en contacto con nuestra oficina. Annie Sable de fax es [place] [phone].  Si tiene un asunto urgente cuando la clnica est cerrada y que no puede esperar hasta el siguiente da hbil, puede llamar/localizar a su doctor(a) al nmero que aparece a continuacin.   Por favor, tenga en cuenta que aunque hacemos todo lo posible para estar disponibles para asuntos urgentes fuera del horario de [place], no estamos disponibles las 24 horas del da, los 7 [address]  [address] de la [place].   Si tiene un problema urgente y no puede comunicarse con nosotros, puede optar por buscar atencin mdica  en el consultorio de su doctor(a), en una clnica privada, en un centro de atencin urgente o en una sala de emergencias.  Si tiene Engineer, drilling, por favor llame inmediatamente al 911 o vaya a la sala de emergencias.  Nmeros de bper  - Dr. Gwen Pounds: [phone]  - Dra. Moye: [phone]  - Dra. Roseanne Reno: [phone]  En caso de inclemencias del [place], por favor llame a Lacy Duverney principal al [phone] para una actualizacin sobre el [place] de cualquier retraso o cierre.  Consejos para la medicacin en dermatologa: Por favor, guarde las cajas en las que vienen los medicamentos de uso tpico para ayudarle a seguir las instrucciones sobre dnde y cmo usarlos. Las farmacias generalmente imprimen las instrucciones  del medicamento slo en las cajas y no directamente en los tubos del [place].   Si su medicamento es muy caro, por favor, pngase en contacto con Rolm Gala llamando al [phone] y presione la opcin 4 o envenos un mensaje a travs de Clinical cytogeneticist.   No podemos decirle cul ser su copago por los medicamentos por adelantado ya que esto es diferente dependiendo  de la cobertura de su seguro. Sin embargo, es posible que podamos encontrar un medicamento sustituto a Audiological scientist un formulario para que el seguro cubra el medicamento que se considera necesario.   Si se requiere una autorizacin previa para que su compaa de seguros [place] su medicamento, por favor permtanos de 1 a 2 das hbiles para completar [address]  Los precios de los medicamentos varan con frecuencia dependiendo del Environmental consultant de dnde se surte la receta y alguna farmacias pueden ofrecer precios ms baratos.  El sitio web www.goodrx.com tiene cupones para medicamentos de Health and safety inspector. Los precios aqu no tienen en cuenta lo que podra costar con la ayuda del seguro (puede ser ms barato con su seguro), pero el sitio web puede darle el precio si no utiliz Tourist information centre manager.  - Puede imprimir el cupn correspondiente y llevarlo con su receta a la farmacia.  - Tambin puede pasar por nuestra oficina durante el horario de atencin regular y Education officer, museum una tarjeta de cupones de GoodRx.  - Si necesita que su receta se enve electrnicamente a una farmacia diferente, informe a nuestra oficina a travs de MyChart de  o por telfono llamando al [phone] y presione la opcin 4.

## 2023-04-23 NOTE — Progress Notes (Signed)
Follow-Up Visit   Subjective  Whitney Liu is a 77 y.o. female who presents for the following: Skin Cancer Screening and Full Body Skin Exam Hx of bcc, hx of scc, hx of aks, hx of isks. Reports a wound at left ankle she would like checked. Cut left ankle a week ago on cardboard drink box. Also reports itchy spot at left back she would like checked.   The patient presents for Total-Body Skin Exam (TBSE) for skin cancer screening and mole check. The patient has spots, moles and lesions to be evaluated, some may be new or changing and the patient has concerns that these could be cancer.   The following portions of the chart were reviewed this encounter and updated as appropriate: medications, allergies, medical history  Review of Systems:  No other skin or systemic complaints except as noted in HPI or Assessment and Plan.  Objective  Well appearing patient in no apparent distress; mood and affect are within normal limits.  A full examination was performed including scalp, head, eyes, ears, nose, lips, neck, chest, axillae, abdomen, back, buttocks, bilateral upper extremities, bilateral lower extremities, hands, feet, fingers, toes, fingernails, and toenails. All findings within normal limits unless otherwise noted below.   Relevant physical exam findings are noted in the Assessment and Plan.  left medial ankle 3.5 x 1 cm wound angular ulceration with pink granulation tissue at base, Tender and painful to touch  left chest x 1, left wrist x 1, left hand x 3 (5) Erythematous thin papules/macules with gritty scale.   left spinal mid back x1 Erythematous stuck-on, waxy papule    Assessment & Plan   LENTIGINES, SEBORRHEIC KERATOSES, HEMANGIOMAS - Benign normal skin lesions - Benign-appearing - Call for any changes  MELANOCYTIC NEVI - Tan-brown and/or pink-flesh-colored symmetric macules and papules - Benign appearing on exam today - Observation - Call clinic for new or changing  moles - Recommend daily use of broad spectrum spf 30+ sunscreen to sun-exposed areas.  Nevus (2) L lower  flank 7.0 x 4.28mm brown speckled macule   Left Lower Back 5.0 x 3.12mm med dark brown macule   Benign-appearing. Stable compared to previous visit. Observation.  Call clinic for new or changing moles.  Recommend daily use of broad spectrum spf 30+ sunscreen to sun-exposed areas.     ACTINIC DAMAGE - Chronic condition, secondary to cumulative UV/sun exposure - diffuse scaly erythematous macules with underlying dyspigmentation - Recommend daily broad spectrum sunscreen SPF 30+ to sun-exposed areas, reapply every 2 hours as needed.  - Staying in the shade or wearing long sleeves, sun glasses (UVA+UVB protection) and wide brim hats (4-inch brim around the entire circumference of the hat) are also recommended for sun protection.  - Call for new or changing lesions.  HISTORY OF BASAL CELL CARCINOMA OF THE SKIN Multiple locations including Rt posterior thigh 02/2020  - No evidence of recurrence today - Recommend regular full body skin exams - Recommend daily broad spectrum sunscreen SPF 30+ to sun-exposed areas, reapply every 2 hours as needed.  - Call if any new or changing lesions are noted between office visits  HISTORY OF SQUAMOUS CELL CARCINOMA OF THE SKIN Multiple locations see history - No evidence of recurrence today - Recommend regular full body skin exams - Recommend daily broad spectrum sunscreen SPF 30+ to sun-exposed areas, reapply every 2 hours as needed.  - Call if any new or changing lesions are noted between office visits  HISTORY OF PRECANCEROUS  ACTINIC KERATOSIS - site(s) of PreCancerous Actinic Keratosis clear today. - these may recur and new lesions may form requiring treatment to prevent transformation into skin cancer - observe for new or changing spots and contact [facility] for appointment if occur - photoprotection with sun protective clothing;  sunglasses and broad spectrum sunscreen with SPF of at least 30 + and frequent self skin exams recommended - yearly exams by a dermatologist recommended for persons with history of PreCancerous Actinic Keratoses  SKIN CANCER SCREENING PERFORMED TODAY.  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 Condition is bothersome/symptomatic for patient.  Leg ulcers can take months to heal on lower leg.  Culture to r/o infection  Start doxycycline 100 mg cap - take 1 po bid with food for 2 weeks.  Doxycycline should be taken with food to prevent nausea. Do not lay down for 30 minutes after taking. Be cautious with sun exposure and use good sun protection while on this medication. Pregnant women should not take this medication.   Start mupirocin 2 % ointment - apply topically to wound twice daily and cover with bandage  Patient will send in photo of wound  Will recheck in 1 month   mupirocin ointment (BACTROBAN) 2 % - left medial ankle Apply 1 Application topically 2 (two) times daily. Cover with bandage until healed.  doxycycline (ADOXA) 100 MG tablet - left medial ankle Take 1 tablet (100 mg total) by mouth 2 (two) times daily for 14 days.  Anaerobic and Aerobic Culture - left medial ankle  Actinic keratosis (5) left chest x 1, left wrist x 1, left hand x 3  Actinic keratoses are precancerous spots that appear secondary to cumulative UV radiation exposure/sun exposure over time. They are chronic with expected duration over 1 year. A portion of actinic keratoses will progress to squamous cell carcinoma of the skin. It is not possible to reliably predict which spots will progress to skin cancer and so treatment is recommended to prevent development of skin cancer.  Recommend daily broad spectrum sunscreen SPF 30+ to sun-exposed areas, reapply every 2 hours as needed.  Recommend staying  in the shade or wearing long sleeves, sun glasses (UVA+UVB protection) and wide brim hats (4-inch brim around the entire circumference of the hat). Call for new or changing lesions.  Destruction of lesion - left chest x 1, left wrist x 1, left hand x 3  Destruction method: cryotherapy   Informed consent: discussed and consent obtained   Lesion destroyed using liquid nitrogen: Yes   Region frozen until ice ball extended beyond lesion: Yes   Outcome: patient tolerated procedure well with no complications   Post-procedure details: wound care instructions given   Additional details:  Prior to procedure, discussed risks of blister formation, small wound, skin dyspigmentation, or rare scar following cryotherapy. Recommend Vaseline ointment to treated areas while healing.   Inflamed seborrheic keratosis left spinal mid back x1  Symptomatic, irritating, patient would like treated.  Destruction of lesion - left spinal mid back x1  Destruction method: cryotherapy   Informed consent: discussed and consent obtained   Lesion destroyed using liquid nitrogen: Yes   Region frozen until ice ball extended beyond lesion: Yes   Outcome: patient tolerated procedure well with no complications   Post-procedure details: wound care instructions given   Additional details:  Prior to procedure, discussed risks of blister formation, small  wound, skin dyspigmentation, or rare scar following cryotherapy. Recommend Vaseline ointment to treated areas while healing.    Return for 1 month recheck wound, 1 tbse .  I, Asher Muir, CMA, am acting as scribe for Willeen Niece, MD.   Documentation: I have reviewed the above documentation for accuracy and completeness, and I agree with the above.  Willeen Niece, MD

## 2023-04-24 ENCOUNTER — Encounter: Payer: Self-pay | Admitting: Dermatology

## 2023-04-27 LAB — ANAEROBIC AND AEROBIC CULTURE

## 2023-04-30 ENCOUNTER — Telehealth: Payer: Self-pay

## 2023-04-30 NOTE — Telephone Encounter (Signed)
Advised patient bacterial culture was negative.

## 2023-04-30 NOTE — Telephone Encounter (Signed)
-----   Message from Willeen Niece, MD sent at 04/30/2023  1:01 PM EDT ----- Bacterial culture is negative - please call patient

## 2023-05-22 ENCOUNTER — Ambulatory Visit: Payer: Medicare PPO | Admitting: Dermatology

## 2023-05-22 DIAGNOSIS — T148XXA Other injury of unspecified body region, initial encounter: Secondary | ICD-10-CM

## 2023-05-22 DIAGNOSIS — S91002A Unspecified open wound, left ankle, initial encounter: Secondary | ICD-10-CM

## 2023-05-22 DIAGNOSIS — L97321 Non-pressure chronic ulcer of left ankle limited to breakdown of skin: Secondary | ICD-10-CM | POA: Diagnosis not present

## 2023-05-22 DIAGNOSIS — L97921 Non-pressure chronic ulcer of unspecified part of left lower leg limited to breakdown of skin: Secondary | ICD-10-CM

## 2023-05-22 MED ORDER — MUPIROCIN 2 % EX OINT: 1.0000 | TOPICAL_OINTMENT | Freq: Two times a day (BID) | CUTANEOUS | 0 refills | Status: AC

## 2023-05-22 NOTE — Progress Notes (Signed)
   Follow-Up Visit   Subjective  Whitney Liu is a 77 y.o. female who presents for the following: wound follow up at left ankle. Patient currently applying mupirocin and covering with a band aid. She did do a course of doxycycline 100 mg twice daily for 2 weeks a month ago and had a negative culture.  Wound does seem to be draining some pus every day.   The following portions of the chart were reviewed this encounter and updated as appropriate: medications, allergies, medical history  Review of Systems:  No other skin or systemic complaints except as noted in HPI or Assessment and Plan.  Objective  Well appearing patient in no apparent distress; mood and affect are within normal limits.   A focused examination was performed of the following areas: Left leg, ankle  Relevant exam findings are noted in the Assessment and Plan.  left medial ankle 2.5 x 1.5 cm ulceration with yellow fibrinous exudate and violaceous edge       Assessment & Plan     Open wound left medial ankle  Traumatic ulceration- Hx of injury, currently healing   Chronic and persistent condition with expected duration over 3-4 months. Condition is bothersome/symptomatic for patient.  Leg ulcers can take months to heal on lower leg.  Repeat culture today Recommend hypochlorous spray daily, follow with mupirocin and cover with band aid.     Anaerobic and Aerobic Culture - left medial ankle  mupirocin ointment (BACTROBAN) 2 % - left medial ankle Apply 1 Application topically 2 (two) times daily. Cover with bandage until healed.    Return for 4 -6 weeks.  Anise Salvo, RMA, am acting as scribe for Willeen Niece, MD .   Documentation: I have reviewed the above documentation for accuracy and completeness, and I agree with the above.  Willeen Niece, MD

## 2023-05-22 NOTE — Patient Instructions (Addendum)
Recommend [facility] Hypochlorous Spray (found in the wound care section) The [facility] Hypochlorous Spray can be sprayed on daily and left on.  Continue mupirocin daily and cover with bandage.  Due to recent changes in healthcare laws, you may see results of your pathology and/or laboratory studies on MyChart before the doctors have had a chance to review them. We understand that in some cases there may be results that are confusing or concerning to you. Please understand that not all results are received at the same time and often the doctors may need to interpret multiple results in order to provide you with the best plan of care or course of treatment. Therefore, we ask that you please give [place] 2 business days to thoroughly review all your results before contacting the office for clarification. Should we see a critical lab result, you will be contacted sooner.   If You Need Anything After Your Visit  If you have any questions or concerns for your doctor, please call our main line at [phone] and press option 4 to reach your doctor's medical assistant. If no one answers, please leave a voicemail as directed and we will return your call as soon as possible. Messages left after 4 pm will be answered the following business day.   You may also send [place] a message via MyChart. We typically respond to MyChart messages within 1-2 business days.  For prescription refills, please ask your pharmacy to contact our office. Our fax number is [phone].  If you have an urgent issue when the clinic is closed that cannot wait until the next business day, you can page your doctor at the number below.    Please note that while we do our best to be available for urgent issues outside of office hours, we are not available 24/7.   If you have an urgent issue and are unable to reach [place], you may choose to seek medical care at your doctor's office, retail clinic, urgent care center, or emergency room.  If you have  a medical emergency, please immediately call 911 or go to the emergency department.  Pager Numbers  - Dr. Gwen Pounds: [phone]  - Dr. Neale Burly: [phone]  - Dr. Roseanne Reno: [phone]  In the event of inclement weather, please call our main line at [phone] for an update on the status of any delays or closures.  Dermatology Medication Tips: Please keep the boxes that topical medications come in in order to help keep track of the instructions about where and how to use these. Pharmacies typically print the medication instructions only on the boxes and not directly on the medication tubes.   If your medication is too expensive, please contact our office at [phone] option 4 or send [place] a message through MyChart.   We are unable to tell what your co-pay for medications will be in advance as this is different depending on your insurance coverage. However, we may be able to find a substitute medication at lower cost or fill out paperwork to get insurance to cover a needed medication.   If a prior authorization is required to get your medication covered by your insurance company, please allow [place] 1-2 business days to complete this process.  Drug prices often vary depending on where the prescription is filled and some pharmacies may offer cheaper prices.  The website www.goodrx.com contains coupons for medications through different pharmacies. The prices here do not account for what the cost may be with help from insurance (it may be  cheaper with your insurance), but the website can give you the price if you did not use any insurance.  - You can print the associated coupon and take it with your prescription to the pharmacy.  - You may also stop by our office during regular business hours and pick up a GoodRx coupon card.  - If you need your prescription sent electronically to a different pharmacy, notify our office through [facility] or by phone at [phone] option 4.

## 2023-05-28 ENCOUNTER — Telehealth: Payer: Self-pay

## 2023-05-28 LAB — ANAEROBIC AND AEROBIC CULTURE

## 2023-05-28 NOTE — Telephone Encounter (Signed)
-----   Message from Tara Stewart, MD sent at 05/28/2023  8:53 AM EDT ----- Culture is positive for Pseudomonas aeruginosa.  Need to send in Cipro 500 mg PO bid x 1 week. Continue hypochlorous spray and start gentamicin ointment to wound twice daily and cover. Can d/c mupirocin ointment.    - please call patient 

## 2023-05-28 NOTE — Telephone Encounter (Signed)
Left message for patient to call back for culture results

## 2023-05-29 ENCOUNTER — Other Ambulatory Visit: Payer: Self-pay

## 2023-05-29 MED ORDER — GENTAMICIN SULFATE 0.1 % EX OINT: TOPICAL_OINTMENT | CUTANEOUS | 0 refills | Status: DC

## 2023-05-29 MED ORDER — CIPROFLOXACIN HCL 500 MG PO TABS: 500.0000 mg | ORAL_TABLET | Freq: Two times a day (BID) | ORAL | 0 refills | Status: AC

## 2023-05-29 NOTE — Telephone Encounter (Signed)
Left message for pt to call for results. Also sent message in MyChart with results and Rxs sent to [facility].

## 2023-06-04 ENCOUNTER — Telehealth: Payer: Self-pay

## 2023-06-04 NOTE — Telephone Encounter (Signed)
Patient called here LM on VM she read her culture results and she started Cipro 500 mg she is already starting to improve, she will continue hypochlorous spray and start gentamicin ointment to wound twice daily and cover.  She does not need a call back

## 2023-06-04 NOTE — Telephone Encounter (Signed)
-----   Message from Tara Stewart, MD sent at 05/28/2023  8:53 AM EDT ----- Culture is positive for Pseudomonas aeruginosa.  Need to send in Cipro 500 mg PO bid x 1 week. Continue hypochlorous spray and start gentamicin ointment to wound twice daily and cover. Can d/c mupirocin ointment.    - please call patient 

## 2023-06-04 NOTE — Telephone Encounter (Signed)
-----   Message from Willeen Niece, MD sent at 05/28/2023  8:53 AM EDT ----- Culture is positive for Pseudomonas aeruginosa.  Need to send in Cipro 500 mg PO bid x 1 week. Continue hypochlorous spray and start gentamicin ointment to wound twice daily and cover. Can d/c mupirocin ointment.    - please call patient

## 2023-06-04 NOTE — Telephone Encounter (Signed)
Left pt msg to call and confirm she received the mychart message advising of results and medications being sent to her pharmacy.Whitney Liu

## 2023-06-19 ENCOUNTER — Ambulatory Visit: Payer: Medicare PPO | Admitting: Dermatology

## 2023-06-19 VITALS — BP 128/77 | HR 72

## 2023-06-19 DIAGNOSIS — L97321 Non-pressure chronic ulcer of left ankle limited to breakdown of skin: Secondary | ICD-10-CM | POA: Diagnosis not present

## 2023-06-19 DIAGNOSIS — T1490XA Injury, unspecified, initial encounter: Secondary | ICD-10-CM

## 2023-06-19 DIAGNOSIS — S91002D Unspecified open wound, left ankle, subsequent encounter: Secondary | ICD-10-CM

## 2023-06-19 NOTE — Progress Notes (Signed)
   Follow-Up Visit   Subjective  Whitney Liu is a 77 y.o. female who presents for the following: 4-6 week follow-up traumatic injury of the left medial ankle. Wound is slowly healing. She is still getting yellowish drainage. She finished Cipro antibiotic as prescribed. She is applying gentamicin ointment daily.    The following portions of the chart were reviewed this encounter and updated as appropriate: medications, allergies, medical history  Review of Systems:  No other skin or systemic complaints except as noted in HPI or Assessment and Plan.  Objective  Well appearing patient in no apparent distress; mood and affect are within normal limits.  A focused examination was performed of the following areas: Face, left leg  Relevant physical exam findings are noted in the Assessment and Plan.  Left Medial Ankle 1.5 x 0.7 CM ulceration       Assessment & Plan   Traumatic injury Left Medial Ankle  Healing Ulceration- Improving Previous culture from 05/13/23 positive for Pseudomonas aeruginosa. Patient finished Cipro BID x 7 days.   Continue hypochlorous spray and gentamicin ointment to wound twice daily and cover.  Repeat Bacterial Culture performed today.   Discussed ulceration can take several months to heal  Anaerobic and Aerobic Culture - Left Medial Ankle     Return in about 3 months (around 09/19/2023) for recheck traumatic injury left med ankle.  ICherlyn Labella, CMA, am acting as scribe for Willeen Niece, MD .   Documentation: I have reviewed the above documentation for accuracy and completeness, and I agree with the above.  Willeen Niece, MD

## 2023-06-19 NOTE — Patient Instructions (Signed)

## 2023-06-24 ENCOUNTER — Telehealth: Payer: Self-pay

## 2023-06-24 LAB — ANAEROBIC AND AEROBIC CULTURE

## 2023-06-24 MED ORDER — CIPROFLOXACIN HCL 500 MG PO TABS: 500.0000 mg | ORAL_TABLET | Freq: Two times a day (BID) | ORAL | 0 refills | Status: AC

## 2023-06-24 MED ORDER — GENTAMICIN SULFATE 0.1 % EX OINT: TOPICAL_OINTMENT | CUTANEOUS | 0 refills | Status: AC

## 2023-06-24 NOTE — Telephone Encounter (Signed)
Advised pt of culture results.  Sent in Cipro 500mg  1 po bid for 7 days, and sent in Gentamicin ointment./sh

## 2023-06-24 NOTE — Telephone Encounter (Signed)
Left pt msg to call for culture results/sh ?

## 2023-06-24 NOTE — Addendum Note (Signed)
Addended by: Mitzi Davenport on: 06/24/2023 04:49 PM   Modules accepted: Orders

## 2023-06-24 NOTE — Telephone Encounter (Signed)
-----   Message from Willeen Niece sent at 06/24/2023 12:49 PM EDT ----- Culture positive for Klebsiella (gm neg.), send in rf PO Cipro, continue wound care gentle cleansing soap/water, hypochlorous spray, gentamicin ointment, cover - please call patient

## 2023-09-03 ENCOUNTER — Other Ambulatory Visit: Payer: Self-pay | Admitting: Family Medicine

## 2023-09-03 DIAGNOSIS — Z1231 Encounter for screening mammogram for malignant neoplasm of breast: Secondary | ICD-10-CM

## 2023-10-08 ENCOUNTER — Ambulatory Visit
Admission: RE | Admit: 2023-10-08 | Discharge: 2023-10-08 | Disposition: A | Discharge status: Home / Self Care | Payer: Medicare PPO | Source: Ambulatory Visit | Attending: Family Medicine | Admitting: Family Medicine

## 2023-10-08 DIAGNOSIS — Z1231 Encounter for screening mammogram for malignant neoplasm of breast: Secondary | ICD-10-CM | POA: Diagnosis present

## 2023-10-14 ENCOUNTER — Ambulatory Visit: Payer: Medicare PPO | Admitting: Dermatology

## 2023-10-14 DIAGNOSIS — W908XXA Exposure to other nonionizing radiation, initial encounter: Secondary | ICD-10-CM

## 2023-10-14 DIAGNOSIS — L820 Inflamed seborrheic keratosis: Secondary | ICD-10-CM | POA: Diagnosis not present

## 2023-10-14 DIAGNOSIS — L905 Scar conditions and fibrosis of skin: Secondary | ICD-10-CM

## 2023-10-14 DIAGNOSIS — L578 Other skin changes due to chronic exposure to nonionizing radiation: Secondary | ICD-10-CM | POA: Diagnosis not present

## 2023-10-14 NOTE — Progress Notes (Signed)
   Follow-Up Visit   Subjective  Whitney Liu is a 77 y.o. female who presents for the following: patient states wound closed about 4 weeks ago and is still sore and swells at times. Patient reports a spot at left side of lower back she would like checked. Sometimes itchy  The patient has spots, moles and lesions to be evaluated, some may be new or changing and the patient may have concern these could be cancer.   The following portions of the chart were reviewed this encounter and updated as appropriate: medications, allergies, medical history  Review of Systems:  No other skin or systemic complaints except as noted in HPI or Assessment and Plan.  Objective  Well appearing patient in no apparent distress; mood and affect are within normal limits.  A focused examination was performed of the following areas: Left lower back, left ankle  Relevant exam findings are noted in the Assessment and Plan.  Left Lower Back x 1 Erythematous stuck-on, waxy papule or plaque    Assessment & Plan    SCAR- History of traumatic injury at left ankle Exam: Well healed scar on left medial ankle with violaceous discoloration  Treatment Plan: Benign. Observe No recommended treatment at this time   .  Inflamed seborrheic keratosis Left Lower Back x 1  Symptomatic, irritating, patient would like treated.  Destruction of lesion - Left Lower Back x 1  Destruction method: cryotherapy   Informed consent: discussed and consent obtained   Lesion destroyed using liquid nitrogen: Yes   Region frozen until ice ball extended beyond lesion: Yes   Outcome: patient tolerated procedure well with no complications   Post-procedure details: wound care instructions given   Additional details:  Prior to procedure, discussed risks of blister formation, small wound, skin dyspigmentation, or rare scar following cryotherapy. Recommend Vaseline ointment to treated areas while healing.   ACTINIC DAMAGE -  chronic, secondary to cumulative UV radiation exposure/sun exposure over time - diffuse scaly erythematous macules with underlying dyspigmentation - Recommend daily broad spectrum sunscreen SPF 30+ to sun-exposed areas, reapply every 2 hours as needed.  - Recommend staying in the shade or wearing long sleeves, sun glasses (UVA+UVB protection) and wide brim hats (4-inch brim around the entire circumference of the hat). - Call for new or changing lesions.   Return for keep follow up as scheduled.  I, Asher Muir, CMA, am acting as scribe for Willeen Niece, MD.   Documentation: I have reviewed the above documentation for accuracy and completeness, and I agree with the above.  Willeen Niece, MD

## 2023-10-14 NOTE — Patient Instructions (Addendum)

## 2024-02-17 ENCOUNTER — Other Ambulatory Visit
Admission: RE | Admit: 2024-02-17 | Discharge: 2024-02-17 | Disposition: A | Discharge status: Home / Self Care | Source: Ambulatory Visit | Attending: Ophthalmology | Admitting: Ophthalmology

## 2024-02-17 DIAGNOSIS — R519 Headache, unspecified: Secondary | ICD-10-CM | POA: Insufficient documentation

## 2024-02-17 LAB — CBC
HCT: 41.4 % (ref 36.0–46.0)
Hemoglobin: 14.2 g/dL (ref 12.0–15.0)
MCH: 31.7 pg (ref 26.0–34.0)
MCHC: 34.3 g/dL (ref 30.0–36.0)
MCV: 92.4 fL (ref 80.0–100.0)
Platelets: 239 10*3/uL (ref 150–400)
RBC: 4.48 MIL/uL (ref 3.87–5.11)
RDW: 11.9 % (ref 11.5–15.5)
WBC: 5 10*3/uL (ref 4.0–10.5)
nRBC: 0 % (ref 0.0–0.2)

## 2024-02-17 LAB — C-REACTIVE PROTEIN: CRP: 0.5 mg/dL (ref <1.0)

## 2024-02-17 LAB — SEDIMENTATION RATE: Sed Rate: 31 mm/h — ABNORMAL HIGH (ref 0–30)

## 2024-04-07 ENCOUNTER — Encounter: Payer: Self-pay | Admitting: Dermatology

## 2024-04-07 ENCOUNTER — Ambulatory Visit: Admitting: Dermatology

## 2024-04-07 DIAGNOSIS — L578 Other skin changes due to chronic exposure to nonionizing radiation: Secondary | ICD-10-CM | POA: Diagnosis not present

## 2024-04-07 DIAGNOSIS — B070 Plantar wart: Secondary | ICD-10-CM

## 2024-04-07 DIAGNOSIS — D492 Neoplasm of unspecified behavior of bone, soft tissue, and skin: Secondary | ICD-10-CM

## 2024-04-07 DIAGNOSIS — C44629 Squamous cell carcinoma of skin of left upper limb, including shoulder: Secondary | ICD-10-CM

## 2024-04-07 DIAGNOSIS — W908XXA Exposure to other nonionizing radiation, initial encounter: Secondary | ICD-10-CM | POA: Diagnosis not present

## 2024-04-07 DIAGNOSIS — B078 Other viral warts: Secondary | ICD-10-CM

## 2024-04-07 NOTE — Patient Instructions (Addendum)
 Wound Care Instructions  Cleanse wound gently with soap and water once a day then pat dry with clean gauze. Apply a thin coat of Petrolatum (petroleum jelly, "Vaseline") over the wound (unless you have an allergy to this). We recommend that you use a new, sterile tube of Vaseline. Do not pick or remove scabs. Do not remove the yellow or white "healing tissue" from the base of the wound.  Cover the wound with fresh, clean, nonstick gauze and secure with paper tape. You may use Band-Aids in place of gauze and tape if the wound is small enough, but would recommend trimming much of the tape off as there is often too much. Sometimes Band-Aids can irritate the skin.  You should call the office for your biopsy report after 1 week if you have not already been contacted.  If you experience any problems, such as abnormal amounts of bleeding, swelling, significant bruising, significant pain, or evidence of infection, please call the office immediately.  FOR ADULT SURGERY PATIENTS: If you need something for pain relief you may take 1 extra strength Tylenol  (acetaminophen ) AND 2 Ibuprofen (200mg  each) together every 4 hours as needed for pain. (do not take these if you are allergic to them or if you have a reason you should not take them.) Typically, you may only need pain medication for 1 to 3 days.      Cryotherapy Aftercare  Wash gently with soap and water everyday.   Apply Vaseline and Band-Aid daily until healed.    Recommend using Curad Mediplast pads. Cut to fit wart or callus. Cover with Elastoplast waterproof tape or any waterproof band-aid. Change every 3 to 4 days, or sooner if necessary.  Treatment may require several months of regular use before results are seen.    Recommend daily broad spectrum sunscreen SPF 30+ to sun-exposed areas, reapply every 2 hours as needed. Call for new or changing lesions.  Staying in the shade or wearing long sleeves, sun glasses (UVA+UVB protection) and wide  brim hats (4-inch brim around the entire circumference of the hat) are also recommended for sun protection.     Due to recent changes in healthcare laws, you may see results of your pathology and/or laboratory studies on MyChart before the doctors have had a chance to review them. We understand that in some cases there may be results that are confusing or concerning to you. Please understand that not all results are received at the same time and often the doctors may need to interpret multiple results in order to provide you with the best plan of care or course of treatment. Therefore, we ask that you please give us  2 business days to thoroughly review all your results before contacting the office for clarification. Should we see a critical lab result, you will be contacted sooner.   If You Need Anything After Your Visit  If you have any questions or concerns for your doctor, please call our main line at [phone] and press option 4 to reach your doctor's medical assistant. If no one answers, please leave a voicemail as directed and we will return your call as soon as possible. Messages left after 4 pm will be answered the following business day.   You may also send us  a message via MyChart. We typically respond to MyChart messages within 1-2 business days.  For prescription refills, please ask your pharmacy to contact our office. Our fax number is [phone].  If you have an urgent issue when the  clinic is closed that cannot wait until the next business day, you can page your doctor at the number below.    Please note that while we do our best to be available for urgent issues outside of office hours, we are not available 24/7.   If you have an urgent issue and are unable to reach us , you may choose to seek medical care at your doctor's office, retail clinic, urgent care center, or emergency room.  If you have a medical emergency, please immediately call 911 or go to the emergency  department.  Pager Numbers  - Dr. Bary Likes: [phone]  - Dr. Annette Barters: [phone]  - Dr. Felipe Horton: [phone]   In the event of inclement weather, please call our main line at [phone] for an update on the status of any delays or closures.  Dermatology Medication Tips: Please keep the boxes that topical medications come in in order to help keep track of the instructions about where and how to use these. Pharmacies typically print the medication instructions only on the boxes and not directly on the medication tubes.   If your medication is too expensive, please contact our office at [phone] option 4 or send us  a message through MyChart.   We are unable to tell what your co-pay for medications will be in advance as this is different depending on your insurance coverage. However, we may be able to find a substitute medication at lower cost or fill out paperwork to get insurance to cover a needed medication.   If a prior authorization is required to get your medication covered by your insurance company, please allow us  1-2 business days to complete this process.  Drug prices often vary depending on where the prescription is filled and some pharmacies may offer cheaper prices.  The website www.goodrx.com contains coupons for medications through different pharmacies. The prices here do not account for what the cost may be with help from insurance (it may be cheaper with your insurance), but the website can give you the price if you did not use any insurance.  - You can print the associated coupon and take it with your prescription to the pharmacy.  - You may also stop by our office during regular business hours and pick up a GoodRx coupon card.  - If you need your prescription sent electronically to a different pharmacy, notify our office through [facility] or by phone at [phone] option 4.     Si Usted Necesita Algo Despus de Su Visita  Tambin puede enviarnos  un mensaje a travs de Clinical cytogeneticist. Por lo general respondemos a los mensajes de MyChart en el transcurso de 1 a 2 das hbiles.  Para renovar recetas, por favor pida a su farmacia que se ponga en contacto con nuestra oficina. Franz Jacks de fax es [place] [phone].  Si tiene un asunto urgente cuando la clnica est cerrada y que no puede esperar hasta el siguiente da hbil, puede llamar/localizar a su doctor(a) al nmero que aparece a continuacin.   Por favor, tenga en cuenta que aunque hacemos todo lo posible para estar disponibles para asuntos urgentes fuera del horario de [place], no estamos disponibles las 24 horas del da, los 7 [address]  [address] de la [place].   Si tiene un problema urgente y no puede comunicarse con nosotros, puede optar por buscar atencin mdica  en el consultorio de su doctor(a), en una clnica privada, en un centro de atencin urgente o en una sala de emergencias.  Si tiene  una emergencia mdica, por favor llame inmediatamente al 911 o vaya a la sala de emergencias.  Nmeros de bper  - Dr. Bary Likes: [phone]  - Dra. Annette Barters: [phone]  - Dr. Felipe Horton: [phone]   En caso de inclemencias del tiempo, por favor llame a Lajuan Pila principal al [phone] para una actualizacin sobre el [place] de cualquier retraso o cierre.  Consejos para la medicacin en dermatologa: Por favor, guarde las cajas en las que vienen los medicamentos de uso tpico para ayudarle a seguir las instrucciones sobre dnde y cmo usarlos. Las farmacias generalmente imprimen las instrucciones del medicamento slo en las cajas y no directamente en los tubos del [place].   Si su medicamento es muy caro, por favor, pngase en contacto con Bettyjane Brunet llamando al [phone] y presione la opcin 4 o envenos un mensaje a travs de Clinical cytogeneticist.   No podemos decirle cul ser su copago por los medicamentos por adelantado ya que esto es diferente dependiendo de la cobertura de su seguro. Sin  embargo, es posible que podamos encontrar un medicamento sustituto a Audiological scientist un formulario para que el seguro cubra el medicamento que se considera necesario.   Si se requiere una autorizacin previa para que su compaa de seguros [place] su medicamento, por favor permtanos de 1 a 2 das hbiles para completar este proceso.  Los precios de los medicamentos varan con frecuencia dependiendo del Environmental consultant de dnde se surte la receta y alguna farmacias pueden ofrecer precios ms baratos.  El sitio web www.goodrx.com tiene cupones para medicamentos de Health and safety inspector. Los precios aqu no tienen en cuenta lo que podra costar con la ayuda del seguro (puede ser ms barato con su seguro), pero el sitio web puede darle el precio si no utiliz Tourist information centre manager.  - Puede imprimir el cupn correspondiente y llevarlo con su receta a la farmacia.  - Tambin puede pasar por nuestra oficina durante el horario de atencin regular y Education officer, museum una tarjeta de cupones de GoodRx.  - Si necesita que su receta se enve electrnicamente a una farmacia diferente, informe a nuestra oficina a travs de MyChart de [place] o por telfono llamando al [phone] y presione la opcin 4.

## 2024-04-07 NOTE — Progress Notes (Signed)
 Follow-Up Visit   Subjective  Whitney Liu is a 78 y.o. female who presents for the following: Spots on left arm and left foot. Painful with pressure. Dur: foot ~2 months, arm ~1 month  The patient has spots, moles and lesions to be evaluated, some may be new or changing and the patient may have concern these could be cancer.    The following portions of the chart were reviewed this encounter and updated as appropriate: medications, allergies, medical history  Review of Systems:  No other skin or systemic complaints except as noted in HPI or Assessment and Plan.  Objective  Well appearing patient in no apparent distress; mood and affect are within normal limits.  A focused examination was performed of the following areas: Left arm, left foot  Relevant physical exam findings are noted in the Assessment and Plan.  Left dorsal proximal forearm 1.2 cm erythematous firm scaly nodule  Left Middle Plantar Surface 3 mm firm keratotic papule  Assessment & Plan   ACTINIC DAMAGE - chronic, secondary to cumulative UV radiation exposure/sun exposure over time - diffuse scaly erythematous macules with underlying dyspigmentation - Recommend daily broad spectrum sunscreen SPF 30+ to sun-exposed areas, reapply every 2 hours as needed.  - Recommend staying in the shade or wearing long sleeves, sun glasses (UVA+UVB protection) and wide brim hats (4-inch brim around the entire circumference of the hat). - Call for new or changing lesions.  NEOPLASM OF SKIN Left dorsal proximal forearm Epidermal / dermal shaving  Lesion diameter (cm):  1.2 Informed consent: discussed and consent obtained   Patient was prepped and draped in usual sterile fashion: Area prepped with alcohol. Anesthesia: the lesion was anesthetized in a standard fashion   Anesthetic:  1% lidocaine  w/ epinephrine  1-100,000 buffered w/ 8.4% NaHCO3 Instrument used: flexible razor blade   Hemostasis achieved with: pressure,  aluminum chloride and electrodesiccation   Outcome: patient tolerated procedure well    Destruction of lesion  Destruction method: electrodesiccation and curettage   Informed consent: discussed and consent obtained   Curettage performed in three different directions: Yes   Electrodesiccation performed over the curetted area: Yes   Final wound size (cm):  1.2 Hemostasis achieved with:  pressure, aluminum chloride and electrodesiccation Outcome: patient tolerated procedure well with no complications   Post-procedure details: wound care instructions given   Additional details:  Mupirocin  ointment and Bandaid applied  Specimen 1 - Surgical pathology Differential Diagnosis: R/O SCC  Check Margins: No OTHER VIRAL WARTS Left Middle Plantar Surface Viral Wart (HPV) Counseling  Discussed viral / HPV (Human Papilloma Virus) etiology and risk of spread /infectivity to other areas of body as well as to other people.  Multiple treatments and methods may be required to clear warts and it is possible treatment may not be successful.  Treatment risks include discoloration; scarring and there is still potential for wart recurrence.  Destruction of lesion - Left Middle Plantar Surface  Destruction method: cryotherapy   Informed consent: discussed and consent obtained   Lesion destroyed using liquid nitrogen: Yes   Region frozen until ice ball extended beyond lesion: Yes   Outcome: patient tolerated procedure well with no complications   Post-procedure details: wound care instructions given   Additional details:  Prior to procedure, discussed risks of blister formation, small wound, skin dyspigmentation, or rare scar following cryotherapy. Recommend Vaseline ointment to treated areas while healing.    Return for TBSE As Scheduled.  I, Jill Parcell, CMA, am acting  as scribe for Artemio Larry, MD.   Documentation: I have reviewed the above documentation for accuracy and completeness, and I agree with  the above.  Artemio Larry, MD

## 2024-04-14 ENCOUNTER — Ambulatory Visit: Payer: Self-pay | Admitting: Dermatology

## 2024-04-14 LAB — SURGICAL PATHOLOGY

## 2024-04-15 ENCOUNTER — Encounter: Payer: Self-pay | Admitting: Dermatology

## 2024-04-15 NOTE — Telephone Encounter (Signed)
 Advised patient biopsy of the left dorsal proximal forearm was SCC and has already been treated with EDC. Patient requested copy of notes, pathology, and photo for cancer policy. Copies left at front desk for patient.

## 2024-04-15 NOTE — Telephone Encounter (Signed)
-----   Message from Artemio Larry sent at 04/14/2024 10:32 PM EDT ----- 1. Skin, left dorsal proximal forearm :       WELL DIFFERENTIATED SQUAMOUS CELL CARCINOMA   SCC skin cancer- already treated with EDC at time of biopsy   - please call patient

## 2024-05-11 ENCOUNTER — Ambulatory Visit: Payer: Medicare PPO | Admitting: Dermatology

## 2024-05-11 ENCOUNTER — Encounter: Payer: Self-pay | Admitting: Dermatology

## 2024-05-11 DIAGNOSIS — L814 Other melanin hyperpigmentation: Secondary | ICD-10-CM

## 2024-05-11 DIAGNOSIS — L578 Other skin changes due to chronic exposure to nonionizing radiation: Secondary | ICD-10-CM | POA: Diagnosis not present

## 2024-05-11 DIAGNOSIS — W908XXA Exposure to other nonionizing radiation, initial encounter: Secondary | ICD-10-CM | POA: Diagnosis not present

## 2024-05-11 DIAGNOSIS — D225 Melanocytic nevi of trunk: Secondary | ICD-10-CM

## 2024-05-11 DIAGNOSIS — Z1283 Encounter for screening for malignant neoplasm of skin: Secondary | ICD-10-CM | POA: Diagnosis not present

## 2024-05-11 DIAGNOSIS — D1801 Hemangioma of skin and subcutaneous tissue: Secondary | ICD-10-CM

## 2024-05-11 DIAGNOSIS — L821 Other seborrheic keratosis: Secondary | ICD-10-CM

## 2024-05-11 DIAGNOSIS — B070 Plantar wart: Secondary | ICD-10-CM

## 2024-05-11 DIAGNOSIS — L820 Inflamed seborrheic keratosis: Secondary | ICD-10-CM

## 2024-05-11 DIAGNOSIS — D229 Melanocytic nevi, unspecified: Secondary | ICD-10-CM

## 2024-05-11 DIAGNOSIS — Z872 Personal history of diseases of the skin and subcutaneous tissue: Secondary | ICD-10-CM

## 2024-05-11 DIAGNOSIS — Z85828 Personal history of other malignant neoplasm of skin: Secondary | ICD-10-CM

## 2024-05-11 DIAGNOSIS — D692 Other nonthrombocytopenic purpura: Secondary | ICD-10-CM

## 2024-05-11 NOTE — Patient Instructions (Addendum)

## 2024-05-11 NOTE — Progress Notes (Signed)
 Follow-Up Visit   Subjective  Whitney Liu is a 78 y.o. female who presents for the following: Skin Cancer Screening and Full Body Skin Exam, hx of BCCs, Hx of SCCs, hx of Aks, check spot R flank at bra, wart recheck L middle plantar surface.  Spots on left leg are itchy.  The patient presents for Total-Body Skin Exam (TBSE) for skin cancer screening and mole check. The patient has spots, moles and lesions to be evaluated, some may be new or changing and the patient may have concern these could be cancer.    The following portions of the chart were reviewed this encounter and updated as appropriate: medications, allergies, medical history  Review of Systems:  No other skin or systemic complaints except as noted in HPI or Assessment and Plan.  Objective  Well appearing patient in no apparent distress; mood and affect are within normal limits.  A full examination was performed including scalp, head, eyes, ears, nose, lips, neck, chest, axillae, abdomen, back, buttocks, bilateral upper extremities, bilateral lower extremities, hands, feet, fingers, toes, fingernails, and toenails. All findings within normal limits unless otherwise noted below.   Relevant physical exam findings are noted in the Assessment and Plan.  L upper pretibia x 2, L popliteal x 2 (4) Stuck on waxy papules with erythema  Assessment & Plan   SKIN CANCER SCREENING PERFORMED TODAY.  ACTINIC DAMAGE chest - Chronic condition, secondary to cumulative UV/sun exposure - diffuse scaly erythematous macules with underlying dyspigmentation - Recommend daily broad spectrum sunscreen SPF 30+ to sun-exposed areas, reapply every 2 hours as needed.  - Staying in the shade or wearing long sleeves, sun glasses (UVA+UVB protection) and wide brim hats (4-inch brim around the entire circumference of the hat) are also recommended for sun protection.  - Call for new or changing lesions.  LENTIGINES, SEBORRHEIC KERATOSES,  HEMANGIOMAS - Benign normal skin lesions - Benign-appearing - Call for any changes - SK R flank  MELANOCYTIC NEVI - Tan-brown and/or pink-flesh-colored symmetric macules and papules - L lower  flank 7.0 x 4.15mm brown speckled macule - Left Lower Back 5.0 x 3.34mm med dark brown macule - Benign appearing on exam today - Observation - Call clinic for new or changing moles - Recommend daily use of broad spectrum spf 30+ sunscreen to sun-exposed areas.    HISTORY OF BASAL CELL CARCINOMA OF THE SKIN - No evidence of recurrence today-L nasal canthus, R post thigh - Recommend regular full body skin exams - Recommend daily broad spectrum sunscreen SPF 30+ to sun-exposed areas, reapply every 2 hours as needed.  - Call if any new or changing lesions are noted between office visits    HISTORY OF SQUAMOUS CELL CARCINOMA OF THE SKIN - No evidence of recurrence today- R post thigh, L upper thigh, L dorsal proximal forearm - Recommend regular full body skin exams - Recommend daily broad spectrum sunscreen SPF 30+ to sun-exposed areas, reapply every 2 hours as needed.  - Call if any new or changing lesions are noted between office visits  WART L foot middle plantar surface Exam: clear today  Treatment Plan: Clear post cryotherapy. Observe for recurrence. Call clinic for new or changing lesions.    Purpura - Chronic; persistent and recurrent.  Treatable, but not curable. - Violaceous macules and patches - Benign - Related to trauma, age, sun damage and/or use of blood thinners, chronic use of topical and/or oral steroids - Observe - Can use OTC arnica containing moisturizer  such as Dermend Bruise Formula if desired - Call for worsening or other concerns  INFLAMED SEBORRHEIC KERATOSIS (4) L upper pretibia x 2, L popliteal x 2 (4) (Vs Hypertrophic AK at pretibia)  Symptomatic, irritating, patient would like treated. Destruction of lesion - L upper pretibia x 2, L popliteal x 2  (4)  Destruction method: cryotherapy   Informed consent: discussed and consent obtained   Lesion destroyed using liquid nitrogen: Yes   Region frozen until ice ball extended beyond lesion: Yes   Outcome: patient tolerated procedure well with no complications   Post-procedure details: wound care instructions given   Additional details:  Prior to procedure, discussed risks of blister formation, small wound, skin dyspigmentation, or rare scar following cryotherapy. Recommend Vaseline ointment to treated areas while healing.   Return in about 6 months (around 11/10/2024) for AK f/u, spot check.h/o BCC/SCC  I, Sonya Hupman, RMA, am acting as scribe for Artemio Larry, MD .   Documentation: I have reviewed the above documentation for accuracy and completeness, and I agree with the above.  Artemio Larry, MD

## 2024-06-16 ENCOUNTER — Other Ambulatory Visit: Payer: Self-pay | Admitting: Family Medicine

## 2024-06-16 DIAGNOSIS — Z03818 Encounter for observation for suspected exposure to other biological agents ruled out: Secondary | ICD-10-CM

## 2024-06-18 ENCOUNTER — Ambulatory Visit
Admission: RE | Admit: 2024-06-18 | Discharge: 2024-06-18 | Disposition: A | Discharge status: Home / Self Care | Payer: Self-pay | Source: Ambulatory Visit | Attending: Family Medicine | Admitting: Family Medicine

## 2024-06-18 DIAGNOSIS — Z03818 Encounter for observation for suspected exposure to other biological agents ruled out: Secondary | ICD-10-CM | POA: Insufficient documentation

## 2024-10-05 ENCOUNTER — Other Ambulatory Visit: Payer: Self-pay | Admitting: Family Medicine

## 2024-10-05 DIAGNOSIS — Z1231 Encounter for screening mammogram for malignant neoplasm of breast: Secondary | ICD-10-CM

## 2024-10-21 ENCOUNTER — Encounter

## 2024-10-22 ENCOUNTER — Ambulatory Visit
Admission: RE | Admit: 2024-10-22 | Discharge: 2024-10-22 | Disposition: A | Discharge status: Home / Self Care | Source: Ambulatory Visit | Attending: Family Medicine | Admitting: Family Medicine

## 2024-10-22 DIAGNOSIS — Z1231 Encounter for screening mammogram for malignant neoplasm of breast: Secondary | ICD-10-CM | POA: Insufficient documentation

## 2024-11-10 ENCOUNTER — Ambulatory Visit: Admitting: Dermatology

## 2024-11-29 NOTE — Progress Notes (Signed)
 Chief Complaint: Chief Complaint  Patient presents with   Cough    Patient with cough and congestion since before thanksgiving.  States symptoms worsened yesterday/last night.  Her throat started becoming painful yesterday.  Productive cough started yesterday.  No fever, body aches, chills.  Has taken tylenol  cold and flu and tessalon pearl.s   Nasal Congestion    Whitney Liu is a 78 y.o. female who presents today for evaluation of cough, nasal congestion and bodyaches beginning over the past 48 hours.  History of Present Illness Whitney Liu is a 79 year old female with gastroesophageal reflux disease who presents with persistent productive cough and acute severe sore throat.  Since before Thanksgiving, she has had a persistent productive cough with thick mucus sputum that initially improved then recurred, without hemoptysis, wheezing, or fever. She has a headache today without vision changes.  She was initially treated for symptoms by her primary care physician including use of Tessalon pearls.  Last night she developed an acute burning sore throat severe enough to wake her from sleep and cause a sensation of difficulty breathing. Cool water and remaining upright gave limited relief. She denies vomiting.  She does have a history of reflux.  She has also developed a cough and bodyaches over the past 24-48 hours.  Earlier in the illness she had two days of nausea that resolved. She has used Tessalon Perles, liquid cough medicine, and an anti-nausea medication, and has now run out of the liquid cough medicine.  Her husband has had similar respiratory symptoms, and she is concerned about re-exposure.   Past Medical History: Past Medical History:  Diagnosis Date   History of basal cell carcinoma    face- followed by Dr. Dorn Ahle   History of migraine    Controlled   History of squamous cell carcinoma    of leg   History of vitamin D  deficiency    HLD (hyperlipidemia)     Osteoporosis, post-menopausal     Past Surgical History: Past Surgical History:  Procedure Laterality Date   HYSTERECTOMY  1987   Ovaries removed   CATARACT EXTRACTION Right 07/10/2017   APPENDECTOMY     Breast node removal, 2011.     MOHS SURGERY     [facility]-Dr. Dorn Ahle   Trigger Finger Release Left    Index Finger    Past Family History: Family History  Problem Relation Age of Onset   Heart disease Father    Diabetes Father    High blood pressure (Hypertension) Father    Bone cancer Sister    Autoimmune disease Sister     Medications: Current Outpatient Medications  Medication Sig Dispense Refill   acetaminophen  (TYLENOL ) 500 MG tablet Take 1,000 mg by mouth every 8 (eight) hours as needed       benzonatate (TESSALON) 200 MG capsule Take 1 capsule (200 mg total) by mouth 3 (three) times daily as needed 30 capsule 0   CALCIUM -VITAMIN D3 ORAL Take by mouth once daily     cetirizine (ZYRTEC) 10 MG tablet Take 10 mg by mouth once daily.     ezetimibe (ZETIA) 10 mg tablet TAKE 1 TABLET BY MOUTH EVERY DAY 90 tablet 1   MULTIVITAMIN ORAL Take by mouth once daily     REPATHA SURECLICK 140 mg/mL PnIj INJECT 140MG  SUBCUTANEOUSLY EVERY 14 DAYS 6 mL 1   sennosides-docusate (SENOKOT-S) 8.6-50 mg tablet Take by mouth as directed for Constipation.     methylPREDNISolone  (MEDROL  DOSEPACK) 4 mg tablet  Follow package directions. 21 tablet 0   MILK THISTLE ORAL Take by mouth once daily (Patient not taking: Reported on 11/28/2024)     oseltamivir (TAMIFLU) 75 MG capsule Take 1 capsule (75 mg total) by mouth 2 (two) times daily for 5 days 10 capsule 0   promethazine  (PHENERGAN ) 12.5 MG tablet Take 1 tablet (12.5 mg total) by mouth every 6 (six) hours as needed for Nausea or Vomiting (Patient not taking: Reported on 11/28/2024) 20 tablet 0   psyllium husk (METAMUCIL ORAL) Take by mouth Gummies as needed (Patient not taking: Reported on 11/28/2024)     traMADoL  (ULTRAM) 50 mg tablet Take 1 tablet (50 mg total) by mouth 2 (two) times daily as needed 1 po bid prn (Patient not taking: Reported on 11/28/2024) 10 tablet 0   No current facility-administered medications for this visit.    Allergies: Allergies  Allergen Reactions   Sulfa (Sulfonamide Antibiotics) Nausea   Fosamax [Alendronate] Other (See Comments)    Bone Pain   Gabapentin Nausea and Dizziness    At 300 mg dosage    Pravastatin Other (See Comments)    GI upset and myalgias   Codeine Nausea     Review of Systems:  A comprehensive 14 point ROS was performed, reviewed by me today, and the pertinent findings are documented in the HPI.   Exam: BP (!) 159/82   Pulse 97   Temp 36.9 C (98.4 F)   Ht 162.6 cm (5' 4)   Wt 73.9 kg (163 lb)   LMP  (LMP Unknown)   SpO2 98%   BMI 27.98 kg/m  General/Constitutional: The patient appears to be well-nourished, well-developed, and in no acute distress. Neuro/Psych: Normal mood and affect, oriented to person, place and time. Eyes: Non-icteric.  Pupils are equal, round, and reactive to light, and exhibit synchronous movement. ENT: Mild erythema to bilateral nasal canals.  Moderate erythema to the posterior oropharynx without exudate.  Bilateral Membranes are intact without evidence of bulging or purulent material.  She does have tenderness palpation over the frontal maxillary sinuses. Lymphatic: Mild cervical lymphadenopathy identified. Respiratory: Mild wheeze noted to bilateral upper lung fields. Cardiovascular: Regular rate and rhythm.  No murmurs. and No edema, swelling or tenderness, except as noted in detailed exam. Integumentary: No impressive skin lesions present, except as noted in detailed exam. Musculoskeletal: Unremarkable, except as noted in detailed exam.  ImagingLabs: Patient tested positive for influenza A at today's appointment.  Impression: Influenza A [J10.1] Influenza A  (primary encounter diagnosis) Sore  throat Encntr for obs for susp expsr to oth biolg agents ruled out  Plan:  1.  Treatment options were discussed today with the patient. 2.  Patient tested positive for influenza A at today's appointment. 3.  Tamiflu prescribed in addition to a Medrol  Dosepak.  Continue with Tessalon Perles for cough suppression. 4.  Continue with increased oral hydration.  Practice proper hand hygiene.  Tylenol  and ibuprofen as needed for body aches and chills. 5.  The patient will follow-up if symptoms fail to improve..  They can call the clinic they have any questions, new symptoms develop or symptoms worsen.  This note was generated in part with voice recognition software and I apologize for any typographical errors that were not detected and corrected.  This note has been created using automated tools and reviewed for accuracy by [facility].   DOROTHA Gustavo Level, PA-C [facility]

## 2024-11-30 ENCOUNTER — Ambulatory Visit: Admitting: Dermatology

## 2024-12-01 NOTE — Progress Notes (Signed)
 HPI The patient is a pleasant 78 year old female who presents for acute on chronic bilateral groin pain with radiating pain to the anterior thighs to the knees and shins.  Her pain is worse when going from the sit to stand position.  She states her pain has flared over the last 3 months beginning late summer 2025 without cause.She describes her pain is moderate-severe that sharp throbbing stiff aching constant with feeling of weakness.  Her pain is worsened with standing walking bending stairs lying in bed squatting and sitting.  Heat and ice can help mildly.  In the past left hip joint injection was very helpful.  Medications have included Tylenol  prednisone ibuprofen.  In the past she has been evaluated for acute low back pain with radiation into the right groin.  Her pain began on 06/13/17 when she was in a gift shop in a [facility]  She had an updated MRI of the lumbar spine on November 04, 2019 which revealed impingement of the left L5 nerve root unchanged since 2018.  She had an MRI of the pelvis on 07/02/17.  In 2016 she had low back pain with radiation into the left buttock, posterior thigh, and posterior calf.  Her symptoms are worsened with standing, walking, lying in bed.  At times she had to sleep in a recliner.  Prednisone, diclofenac, and gabapentin 200 mg nightly were not beneficial.  Flexeril  has assisted with sleep at times.  She is sensitive to medications and tramadol provides some relief but has side effects of constipation.  She had improvement of her left sided symptoms with trochanteric bursitis injections and left S1 transforaminal ESI's.  She is a retired runner, broadcasting/film/video.   She was last evaluated on 10/23/2024 at which time she was experiencing groin pain radiating to the anterior thighs to the knees.  She was to continue with ibuprofen and bilateral hip x-rays were obtained she was scheduled for hip injection and given a trial of tramadol.  At today's visit she states she has noted  98% relief after bilateral hip injections.  She reports that she was able to ride to [place]  and got up and down stairs without any pain spending time with her family.  She is very happy with her level of pain relief and not interested in any treatment changes at this time.  The [place]  Narcotic Database was reviewed today.   Procedures: 11/23/2024: Left hip injection (98% relief) 11/02/2024: Right hip injection (good relief) 11/12/2019: Left hip joint injection performed at [facility] (complete relief for approximately 2 days) 10/07/2019: Left S1 transforaminal ESI (mild relief) 09/15/2019: Left S1 transforaminal ESI (mild relief) 09/11/2019: Left trochanteric bursa injection (moderate relief) 11/06/2017: Right T10 trigger point injection 07/15/2017:  Right trochanteric bursa injection 01/13/2015:  Left trochanteric bursa injection (good relief) 12/15/2014:  Left S1 transforaminal ESI (good relief) 11/17/2014:  Left trochanteric bursa injection (good relief) 03/23/2014:  Left trochanteric bursa injection (good relief) 01/21/2014:  Left S1 transforaminal ESI (good relief) 01/14/2014:  Left L5 trigger-point injection   Past Medical History:  Diagnosis Date   History of basal cell carcinoma    face- followed by Dr. Dorn Ahle   History of migraine    Controlled   History of squamous cell carcinoma    of leg   History of vitamin D  deficiency    HLD (hyperlipidemia)    Osteoporosis, post-menopausal     Past Surgical History:  Procedure Laterality Date   HYSTERECTOMY  1987   Ovaries removed  CATARACT EXTRACTION Right 07/10/2017   APPENDECTOMY     Breast node removal, 2011.     MOHS SURGERY     [facility]-Dr. Dorn Ahle   Trigger Finger Release Left    Index Finger    Social History   Socioeconomic History   Marital status: Married  Tobacco Use   Smoking status: Former    Current packs/day: 0.00    Average packs/day: 0.7 packs/day for 4.0  years (2.8 ttl pk-yrs)    Types: Cigarettes    Start date: 12/03/1964    Quit date: 12/03/1968    Years since quitting: 56.0    Passive exposure: Past   Smokeless tobacco: Never  Vaping Use   Vaping status: Never Used  Substance and Sexual Activity   Alcohol use: Never   Drug use: No   Sexual activity: Defer   Social Drivers of Health   Financial Resource Strain: Low Risk  (10/23/2024)   Overall Financial Resource Strain (CARDIA)    Difficulty of Paying Living Expenses: Not very hard  Food Insecurity: No Food Insecurity (10/23/2024)   Hunger Vital Sign    Worried About Running Out of Food in the Last Year: Never true    Ran Out of Food in the Last Year: Never true  Transportation Needs: No Transportation Needs (10/23/2024)   PRAPARE - Administrator, Civil Service (Medical): No    Lack of Transportation (Non-Medical): No    Current Outpatient Medications on File Prior to Visit  Medication Sig Dispense Refill   acetaminophen  (TYLENOL ) 500 MG tablet Take 1,000 mg by mouth every 8 (eight) hours as needed       benzonatate (TESSALON) 200 MG capsule Take 1 capsule (200 mg total) by mouth 3 (three) times daily as needed 30 capsule 0   CALCIUM -VITAMIN D3 ORAL Take by mouth once daily     cetirizine (ZYRTEC) 10 MG tablet Take 10 mg by mouth once daily.     ezetimibe (ZETIA) 10 mg tablet TAKE 1 TABLET BY MOUTH EVERY DAY 90 tablet 1   methylPREDNISolone  (MEDROL  DOSEPACK) 4 mg tablet Follow package directions. 21 tablet 0   MULTIVITAMIN ORAL Take by mouth once daily     oseltamivir (TAMIFLU) 75 MG capsule Take 1 capsule (75 mg total) by mouth 2 (two) times daily for 5 days 10 capsule 0   REPATHA SURECLICK 140 mg/mL PnIj INJECT 140MG  SUBCUTANEOUSLY EVERY 14 DAYS 6 mL 1   sennosides-docusate (SENOKOT-S) 8.6-50 mg tablet Take by mouth as directed for Constipation.     No current facility-administered medications on file prior to visit.    Allergies as of  12/01/2024 - Reviewed 12/01/2024  Allergen Reaction Noted   Sulfa (sulfonamide antibiotics) Nausea 08/12/2013   Fosamax [alendronate] Other (See Comments) 02/24/2014   Gabapentin Nausea and Dizziness 12/23/2019   Pravastatin Other (See Comments) 02/24/2014   Codeine Nausea     ROS More than 10 system, review of system form was given to the patient to fill out and has been signed by Dr. Avanell and scanned into the patient's chart.   Vital signs Vitals:   12/01/24 0922  BP: (!) 151/84  Pulse: 93  Temp: 36.9 C (98.5 F)  TempSrc: Oral  Weight: 73.9 kg (162 lb 14.7 oz)  Height: 162.6 cm (5' 4.02)  PainSc: 1   PainLoc: Hip    Exam General: Alert oriented well-nourished no distress  Lumbosacral Exam (performed 10/23/2024) On inspection no rash is noted.  On palpation she has  moderate tenderness to the low back buttock and lateral hips.  Lumbar extension with rotation produces mild low back pain bilaterally.  Lower Extremity Exam She has 5/5 strength of bilateral dorsiflexors, knee extensors, and hip flexors there is some giveaway weakness to the right hip flexor.  Sensation light touch is intact throughout bilateral lower extremities.  She has symmetrical 2+ patellar and absent Achilles reflexes.  She does not have ankle clonus.  Straight leg raise is negative bilaterally.  Full range of motion to bilateral hip joints with pain elicited with rotation right greater than left..    SI joint exam performed 07/15/2017: She has positive sacroiliac pain with anterior translation of the sacroiliac joint and Faber testing.  Left-sided exam is negative.  Radiographic Data  Impression 1.  Acute on chronic bilateral groin pain with radiating pain to the bilateral anterior thighs to the knees occasional shins.  Most pain is focal in the groin region with some pain in the low back.  Clinically her symptoms are most consistent with elements of hip mediated pain along with possible element of  lumbar radiculitis.Bilateral hip x-rays from [facility] clinic dated 10/23/2024 revealed mild degenerative changes   Lumbar spine x-rays from [facility] clinic dated 08/27/2024 revealed degenerative changes at L4-5 and L5-S1.  2.  Acute left lateral hip pain and groin pain clinically her symptoms are most consistent with an element of hip mediated pain.Left hip xray preformed at [facility] clinic on 11/09/2019 mild degenerative changes noted.  3.  Acute low back pain with radiation into the left buttock, posterior thigh and posterior calf.  Clinically she has symptoms consistent with degenerative disc disease and a lumbosacral radiculitis specifically L5.  MRI lumbar spine without contrast from [facility] dated November 04, 2019 imaging and report reviewed today.  L5-S1 grade 1 anterolisthesis with broad-based disc bulging.  Mild bilateral facet arthropathy.  Mild bilateral foraminal stenosis.  No central stenosis.  L4-5 broad-based disc bulging and endplate spurring.  Moderate left subarticular stenosis.  Borderline central stenosis.  L3-4 mild disc desiccation with broad-based disc bulging.  Mild bilateral facet arthropathy with out stenosis.  L2-3 well-maintained disc height without central or foraminal stenosis.  L1-2 is unremarkable.  4.   Right-sided low back pain, right buttock pain, and right groin pain.  Clinically her symptoms appear multifactorial with elements of musculoskeletal mechanical pain, trochanteric bursitis, and sacroiliitis.  An element of degenerative disc disease and a lumbosacral radiculitis cannot entirely be ruled out.MRI lumbar spine without contrast [facility] dated November 04, 2019 imaging and report reviewed today.  L5-S1 grade 1 anterolisthesis with broad-based disc bulging.  Mild bilateral facet arthropathy.  Mild bilateral foraminal stenosis.  No central stenosis.  L4-5 broad-based disc bulging and endplate spurring.  Moderate left subarticular stenosis.  Borderline central stenosis.   L3-4 mild disc desiccation with broad-based disc bulging.  Mild bilateral facet arthropathy with out stenosis.  L2-3 well-maintained disc height without central or foraminal stenosis.  L1-2 is unremarkable.  MRI of the pelvis dated 07/02/17 both images and report reviewed today.  Sigmoid diverticulitis is noted with a small volume of free fluid seen in CT of the abdomen and pelvis earlier today.  The hip joints are unremarkable.  5.  Bilateral trochanteric bursitis. 6.  History of muscle spasm of right-sided T10 thoracic paraspinal musculature.  7.  History of migraine headaches, osteoporosis, dyslipidemia and basal cell carcinoma.   Plan 1.  Continue occasional use of ibuprofen.  She prefers to avoid Tylenol . 2.  Continue working with physical therapy at [facility]. 3.  She will follow up with our office as needed  I personally performed the service, non-incident to.  (WP)  WHITNEY MEELER, NP  This note was generated in part with voice recognition software and I apologize for any typographical errors that were not detected and corrected.  PCP:  Dr. Alla

## 2024-12-29 ENCOUNTER — Ambulatory Visit: Admitting: Dermatology

## 2025-01-08 ENCOUNTER — Other Ambulatory Visit: Payer: Self-pay | Admitting: Family Medicine

## 2025-01-08 DIAGNOSIS — M5416 Radiculopathy, lumbar region: Secondary | ICD-10-CM

## 2025-01-08 DIAGNOSIS — M79604 Pain in right leg: Secondary | ICD-10-CM

## 2025-01-11 ENCOUNTER — Ambulatory Visit: Admission: RE | Admit: 2025-01-11 | Source: Ambulatory Visit
